# Patient Record
Sex: Female | Born: 1945 | Race: White | Hispanic: No | Smoking: Former smoker
Health system: Southern US, Community
[De-identification: ages and names within clinical notes are randomized; demographics above are authoritative.]

## PROBLEM LIST (undated history)

## (undated) DIAGNOSIS — I2510 Atherosclerotic heart disease of native coronary artery without angina pectoris: Secondary | ICD-10-CM

## (undated) DIAGNOSIS — I10 Essential (primary) hypertension: Secondary | ICD-10-CM

## (undated) DIAGNOSIS — T7840XA Allergy, unspecified, initial encounter: Secondary | ICD-10-CM

## (undated) DIAGNOSIS — M1990 Unspecified osteoarthritis, unspecified site: Secondary | ICD-10-CM

## (undated) DIAGNOSIS — R29818 Other symptoms and signs involving the nervous system: Secondary | ICD-10-CM

## (undated) DIAGNOSIS — G4730 Sleep apnea, unspecified: Secondary | ICD-10-CM

## (undated) HISTORY — PX: SPINAL FUSION: SHX223

## (undated) HISTORY — PX: TUBAL LIGATION: SHX77

## (undated) HISTORY — DX: Allergy, unspecified, initial encounter: T78.40XA

## (undated) HISTORY — DX: Essential (primary) hypertension: I10

## (undated) HISTORY — DX: Unspecified osteoarthritis, unspecified site: M19.90

---

## 2010-04-15 ENCOUNTER — Ambulatory Visit: Payer: Self-pay | Admitting: Ophthalmology

## 2010-05-14 ENCOUNTER — Ambulatory Visit: Payer: Self-pay | Admitting: Vascular Surgery

## 2015-11-19 ENCOUNTER — Other Ambulatory Visit: Payer: Self-pay | Admitting: Neurology

## 2015-11-19 DIAGNOSIS — M6281 Muscle weakness (generalized): Secondary | ICD-10-CM

## 2015-12-11 ENCOUNTER — Ambulatory Visit
Admission: RE | Admit: 2015-12-11 | Discharge: 2015-12-11 | Disposition: A | Discharge status: Home / Self Care | Payer: Medicare Other | Source: Ambulatory Visit | Attending: Neurology | Admitting: Neurology

## 2015-12-11 DIAGNOSIS — M6281 Muscle weakness (generalized): Secondary | ICD-10-CM | POA: Diagnosis present

## 2015-12-11 DIAGNOSIS — G9389 Other specified disorders of brain: Secondary | ICD-10-CM | POA: Insufficient documentation

## 2015-12-11 MED ORDER — GADOBENATE DIMEGLUMINE 529 MG/ML IV SOLN
20.0000 mL | Freq: Once | INTRAVENOUS | Status: AC | PRN
  Administered 2015-12-11: 18 mL via INTRAVENOUS

## 2016-11-20 ENCOUNTER — Other Ambulatory Visit (INDEPENDENT_AMBULATORY_CARE_PROVIDER_SITE_OTHER): Payer: Self-pay | Admitting: Vascular Surgery

## 2016-11-20 DIAGNOSIS — I6522 Occlusion and stenosis of left carotid artery: Secondary | ICD-10-CM

## 2016-11-24 ENCOUNTER — Ambulatory Visit (INDEPENDENT_AMBULATORY_CARE_PROVIDER_SITE_OTHER): Payer: Medicare Other | Admitting: Vascular Surgery

## 2016-11-24 ENCOUNTER — Encounter (INDEPENDENT_AMBULATORY_CARE_PROVIDER_SITE_OTHER): Payer: Self-pay | Admitting: Vascular Surgery

## 2016-11-24 ENCOUNTER — Ambulatory Visit (INDEPENDENT_AMBULATORY_CARE_PROVIDER_SITE_OTHER): Payer: Medicare Other

## 2016-11-24 ENCOUNTER — Encounter (INDEPENDENT_AMBULATORY_CARE_PROVIDER_SITE_OTHER): Payer: Self-pay

## 2016-11-24 VITALS — BP 130/86 | HR 87 | Resp 18 | Ht 63.0 in | Wt 200.0 lb

## 2016-11-24 DIAGNOSIS — I6529 Occlusion and stenosis of unspecified carotid artery: Secondary | ICD-10-CM | POA: Insufficient documentation

## 2016-11-24 DIAGNOSIS — I6522 Occlusion and stenosis of left carotid artery: Secondary | ICD-10-CM

## 2016-11-24 DIAGNOSIS — I10 Essential (primary) hypertension: Secondary | ICD-10-CM

## 2016-11-24 DIAGNOSIS — I6523 Occlusion and stenosis of bilateral carotid arteries: Secondary | ICD-10-CM

## 2016-11-24 NOTE — Assessment & Plan Note (Signed)
blood pressure control important in reducing the progression of atherosclerotic disease. On appropriate oral medications.  

## 2016-11-24 NOTE — Progress Notes (Signed)
MRN : [id number]  Cathy Harrell is a 71 y.o. ([birth year]) female who presents with chief complaint of  Chief Complaint  Patient presents with  . Follow-up  .  History of Present Illness: Patient returns in follow-up of her body disease. She is doing well with no focal neurologic symptoms. Specifically, the patient denies amaurosis fugax, speech or swallowing difficulties, or arm or leg weakness or numbness. Her biggest complaint is of some pain and numbness in her legs and she has been previously treated with medications for neuropathy without significant improvement. Her carotid duplex today reveals stable, mild 1-39% carotid artery stenosis bilaterally.  Current Outpatient Prescriptions  Medication Sig Dispense Refill  . aspirin 81 MG chewable tablet Chew 81 mg by mouth.    . B Complex Vitamins (VITAMIN B COMPLEX PO) Take by mouth.    . busPIRone (BUSPAR) 15 MG tablet TK 1 T PO BID    . Cholecalciferol (VITAMIN D) 2000 units tablet Take by mouth.    . cyanocobalamin (TH VITAMIN B12) 100 MCG tablet Take by mouth.    Marland Kitchen ibuprofen (ADVIL,MOTRIN) 200 MG tablet Take 200 mg by mouth.    . losartan (COZAAR) 50 MG tablet Take by mouth.    Marland Kitchen PARoxetine (PAXIL) 20 MG tablet TK 1 T PO ONCE D FOR 30 DAYS  2   No current facility-administered medications for this visit.     Past Medical History:  Diagnosis Date  . Allergy   . Hypertension   . Osteoarthritis     Past Surgical History:  Procedure Laterality Date  . SPINAL FUSION    . TUBAL LIGATION      Social History Social History  Substance Use Topics  . Smoking status: Former Games developer  . Smokeless tobacco: Never Used  . Alcohol use Yes     Comment: occasionally    Family History Family History  Problem Relation Age of Onset  . Cancer Sister   . Heart attack Brother   . Cancer Brother      No Known Allergies   REVIEW OF SYSTEMS (Negative unless checked)  Constitutional: [] Weight loss  [] Fever  [] Chills Cardiac:  [] Chest pain   [] Chest pressure   [] Palpitations   [] Shortness of breath when laying flat   [] Shortness of breath at rest   [] Shortness of breath with exertion. Vascular:  [x] Pain in legs with walking   [x] Pain in legs at rest   [] Pain in legs when laying flat   [] Claudication   [] Pain in feet when walking  [] Pain in feet at rest  [] Pain in feet when laying flat   [] History of DVT   [] Phlebitis   [] Swelling in legs   [] Varicose veins   [] Non-healing ulcers Pulmonary:   [] Uses home oxygen   [] Productive cough   [] Hemoptysis   [] Wheeze  [] COPD   [] Asthma Neurologic:  [] Dizziness  [] Blackouts   [] Seizures   [] History of stroke   [] History of TIA  [] Aphasia   [] Temporary blindness   [] Dysphagia   [] Weakness or numbness in arms   [x] Weakness or numbness in legs Musculoskeletal:  [] Arthritis   [] Joint swelling   [] Joint pain   [] Low back pain Hematologic:  [] Easy bruising  [] Easy bleeding   [] Hypercoagulable state   [] Anemic  [] Hepatitis Gastrointestinal:  [] Blood in stool   [] Vomiting blood  [] Gastroesophageal reflux/heartburn   [] Difficulty swallowing. Genitourinary:  [] Chronic kidney disease   [] Difficult urination  [] Frequent urination  [] Burning with urination   [] Blood in urine Skin:  []   Rashes   [] Ulcers   [] Wounds Psychological:  [] History of anxiety   []  History of major depression.  Physical Examination  Vitals:   11/24/16 1332 11/24/16 1333  BP: 132/80 130/86  Pulse: 87   Resp: 18   Weight: 200 lb (90.7 kg)   Height: 5\' 3"  (1.6 m)    Body mass index is 35.43 kg/m. Gen:  WD/WN, NAD Head: /AT, No temporalis wasting. Ear/Nose/Throat: Hearing grossly intact, nares w/o erythema or drainage, trachea midline Eyes: Conjunctiva clear. Sclera non-icteric Neck: Supple.  No bruit or JVD.  Pulmonary:  Good air movement, equal and clear to auscultation bilaterally.  Cardiac: RRR, normal S1, S2, no Murmurs, rubs or gallops. Vascular:  Vessel Right Left  Radial Palpable Palpable  Ulnar  Palpable Palpable  Brachial Palpable Palpable  Carotid Palpable, without bruit Palpable, without bruit  Aorta Not palpable N/A  Femoral Palpable Palpable  Popliteal Palpable Palpable  PT Palpable Palpable  DP Palpable Palpable   Gastrointestinal: soft, non-tender/non-distended. No guarding/reflex.  Musculoskeletal: M/S 5/5 throughout.  No deformity or atrophy. No edema. Neurologic: CN 2-12 intact. Sensation grossly intact in extremities.  Symmetrical.  Speech is fluent. Motor exam as listed above. Psychiatric: Judgment intact, Mood & affect appropriate for pt's clinical situation. Dermatologic: No rashes or ulcers noted.  No cellulitis or open wounds. Lymph : No Cervical, Axillary, or Inguinal lymphadenopathy.     CBC No results found for: WBC, HGB, HCT, MCV, PLT  BMET No results found for: NA, K, CL, CO2, GLUCOSE, BUN, CREATININE, CALCIUM, GFRNONAA, GFRAA CrCl cannot be calculated (No order found.).  COAG No results found for: INR, PROTIME  Radiology No results found.   Assessment/Plan Hypertension blood pressure control important in reducing the progression of atherosclerotic disease. On appropriate oral medications.   Carotid stenosis Her carotid duplex today reveals stable, mild 1-39% carotid artery stenosis bilaterally. This very mild degree of carotid artery stenosis does not increase her stroke risk. We can recheck this in 2 years with a duplex. Contact our office with problems in the interim.    Festus BarrenJason Nailyn Dearinger, MD  11/24/2016 2:07 PM    This note was created with Dragon medical transcription system.  Any errors from dictation are purely unintentional

## 2016-11-24 NOTE — Assessment & Plan Note (Signed)
Her carotid duplex today reveals stable, mild 1-39% carotid artery stenosis bilaterally. This very mild degree of carotid artery stenosis does not increase her stroke risk. We can recheck this in 2 years with a duplex. Contact our office with problems in the interim.

## 2017-02-04 IMAGING — MR MR HEAD WO/W CM
13 series · 44 of 48 positions shown · IV contrast (multihance)
Comparison: CTA of the neck 05/14/2010.  MRI brain 04/15/2010.

CLINICAL DATA: Muscle weakness.  Dizziness and balance issues.

EXAM:
MRI HEAD WITHOUT AND WITH CONTRAST
TECHNIQUE: Multiplanar, multiecho pulse sequences of the brain and surrounding
structures were obtained without and with intravenous contrast.
CONTRAST:  18mL MULTIHANCE GADOBENATE DIMEGLUMINE 529 MG/ML IV SOLN

[Series 2: T1 · sagittal · 5.0mm · 0.45mm/px · 3 of 23 slices shown (1 of 2)]
[im 1/23]
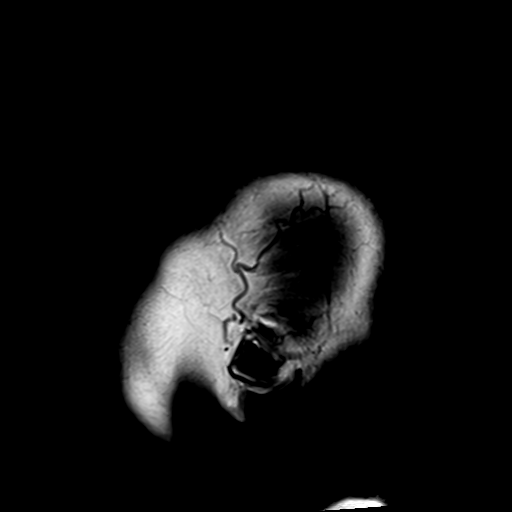
[im 12/23]
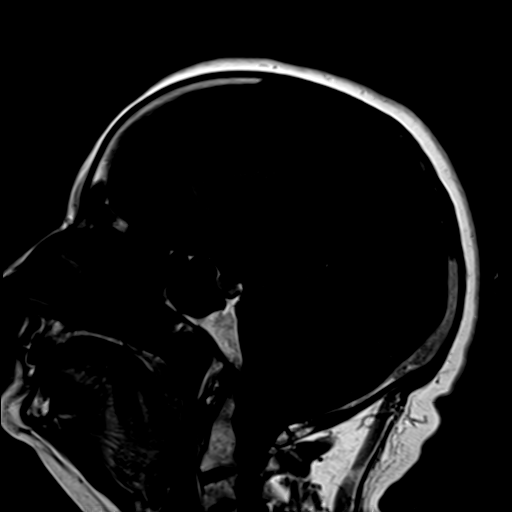
[im 23/23]
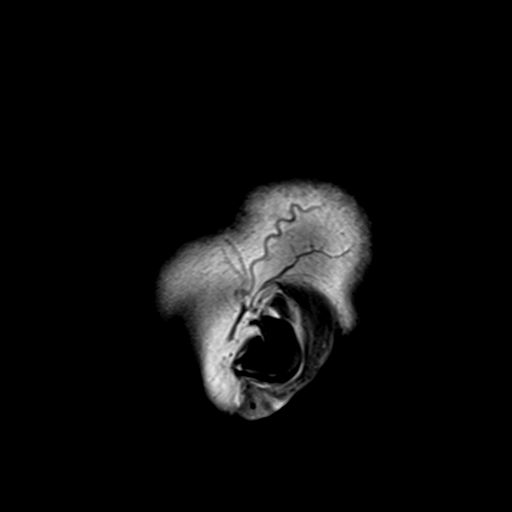

[Series 4: DWI · axial · 3.0mm · 1.20mm/px · z∈[-51,+105]mm · 6 of 55 slices shown (1 of 4)]
[im 1/55]
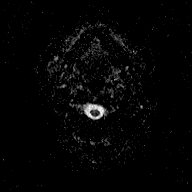
[im 11/55]
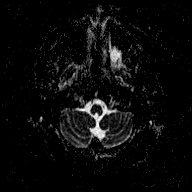
[im 22/55]
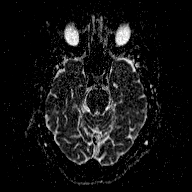
[im 33/55]
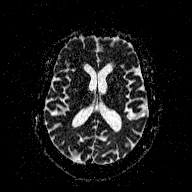
[im 44/55]
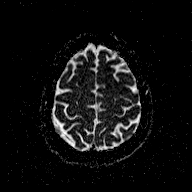
[im 55/55]
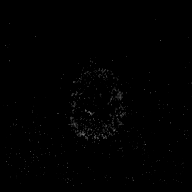

[Series 5: T2 · axial · 5.0mm · 0.72mm/px · z∈[-51,+105]mm · 2 of 26 slices shown (1 of 2)]
[im 1/26]
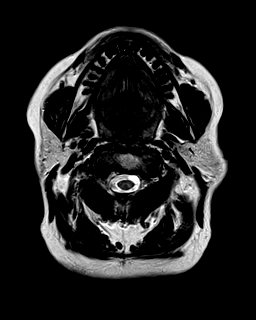
[im 26/26]
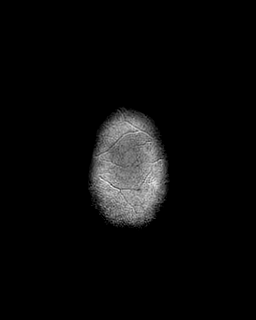

[Series 6: FLAIR · axial · 5.0mm · 0.45mm/px · z∈[-51,+105]mm · 2 of 26 slices shown (1 of 2)]
[im 1/26]
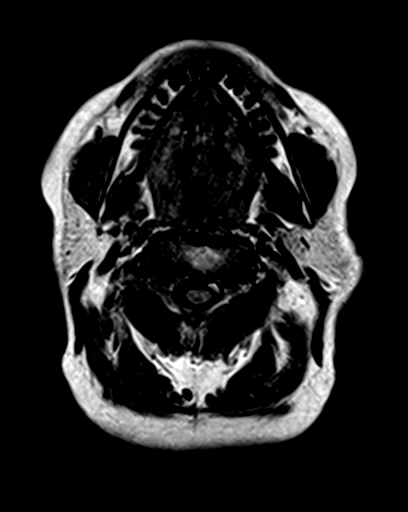
[im 26/26]
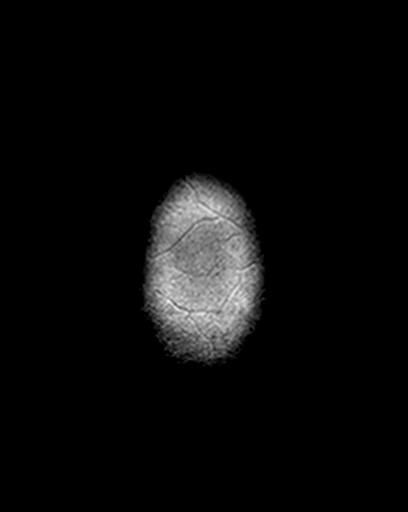

[Series 7: T2 · axial · 5.0mm · 0.72mm/px · z∈[-51,+105]mm · 2 of 26 slices shown (2 of 2)]
[im 1/26]
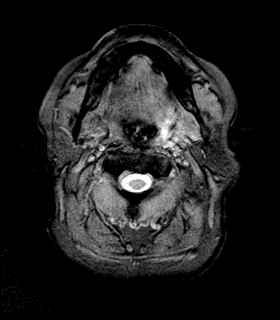
[im 26/26]
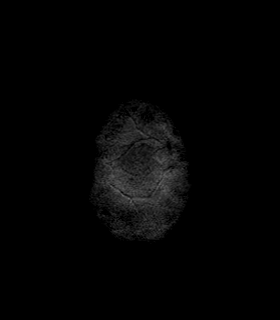

[Series 8: FLAIR · sagittal · 5.0mm · 0.45mm/px · 2 of 23 slices shown (2 of 2)]
[im 1/23]
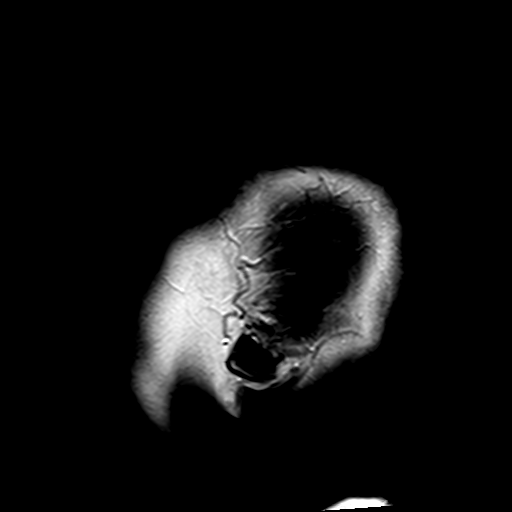
[im 23/23]
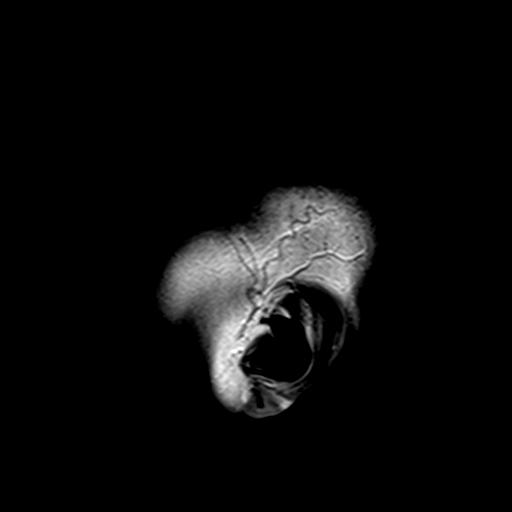

[Series 9: T1 · axial · 3.0mm · 1.00mm/px · z∈[-60,-25]mm · 2 of 64 slices shown (2 of 2)]
[im 1/64]
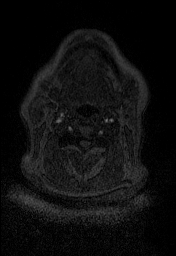
[im 13/64]
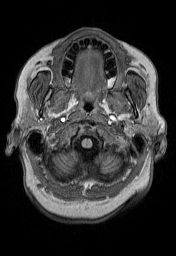

[Series 11: DWI · coronal · 3.0mm · 1.20mm/px · 4 of 45 slices shown (2 of 4)]
[im 1/45]
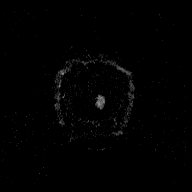
[im 15/45]
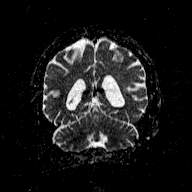
[im 30/45]
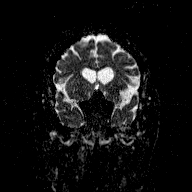
[im 45/45]
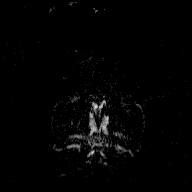

[Series 12: T2 post-contrast · coronal · 5.0mm · 0.45mm/px · 3 of 29 slices shown]
[im 1/29]
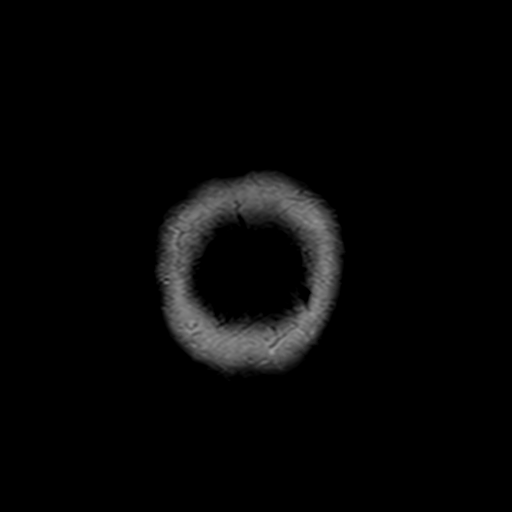
[im 15/29]
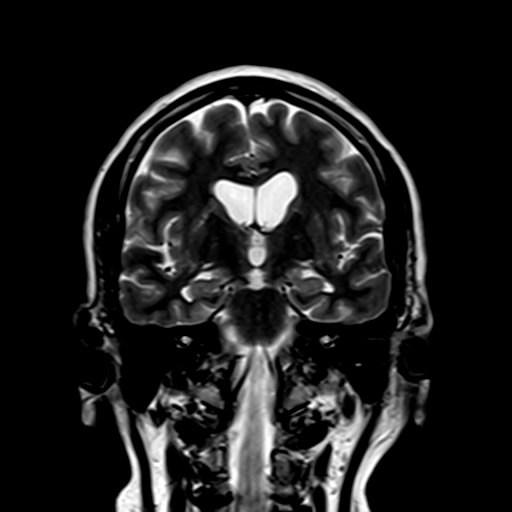
[im 29/29]
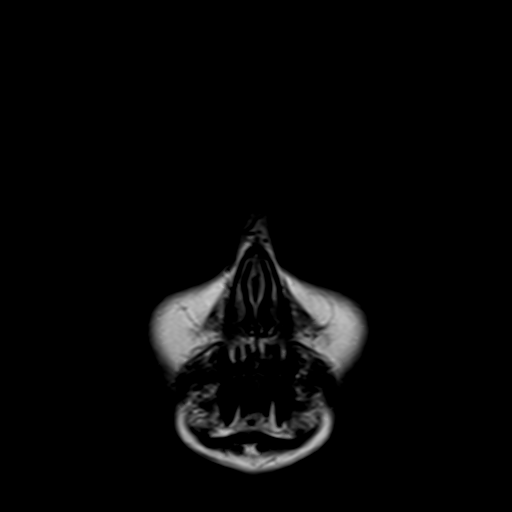

[Series 13: T1 post-contrast · axial · 3.0mm · 1.00mm/px · z∈[-60,+121]mm · 6 of 64 slices shown (1 of 2)]
[im 1/64]
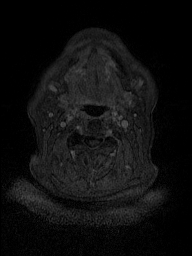
[im 13/64]
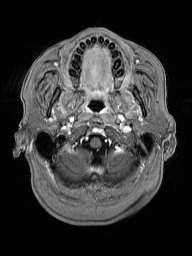
[im 26/64]
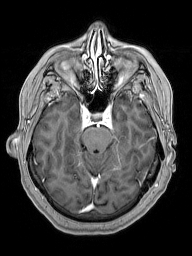
[im 38/64]
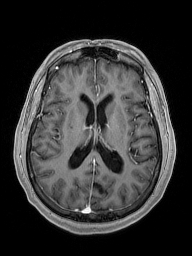
[im 51/64]
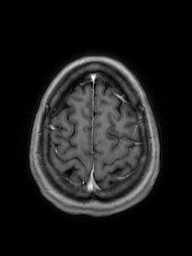
[im 64/64]
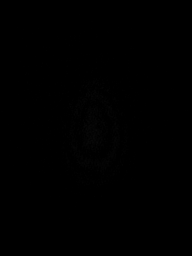

[Series 14: T1 post-contrast · coronal · 5.0mm · 0.45mm/px · 3 of 29 slices shown (2 of 2)]
[im 1/29]
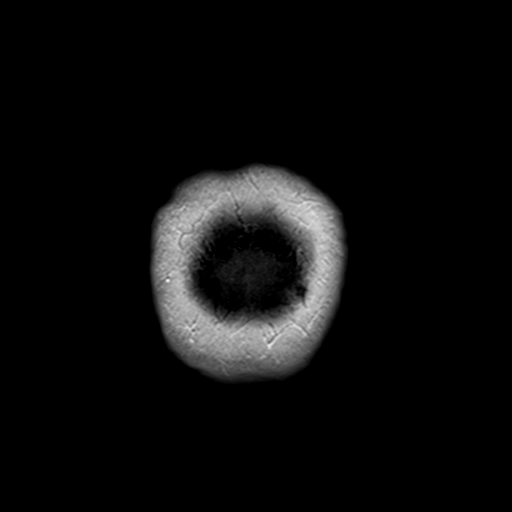
[im 15/29]
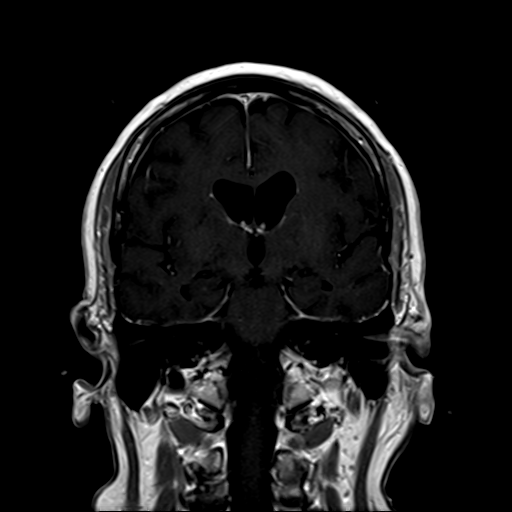
[im 29/29]
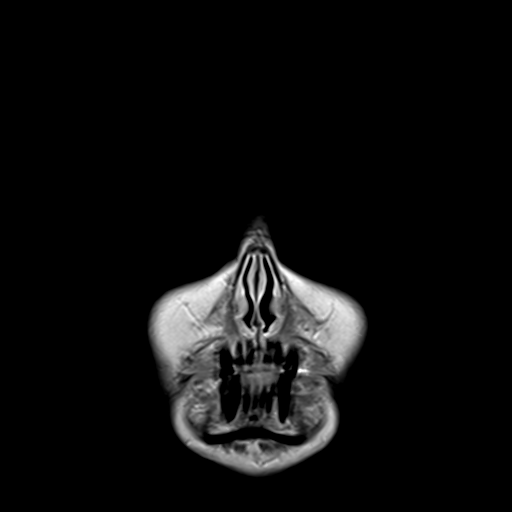

[Series 100: DWI · axial · 3.0mm · 1.20mm/px · z∈[-51,+105]mm · 5 of 55 slices shown (3 of 4)]
[im 1/55]
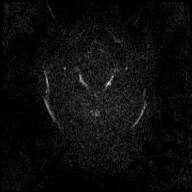
[im 14/55]
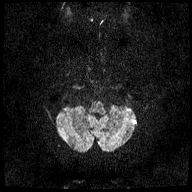
[im 28/55]
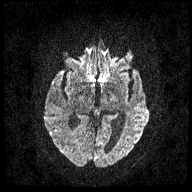
[im 41/55]
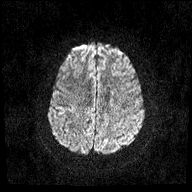
[im 55/55]
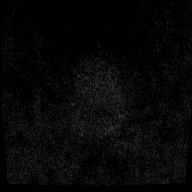

[Series 101: DWI · coronal · 3.0mm · 1.20mm/px · 4 of 45 slices shown (4 of 4)]
[im 1/45]
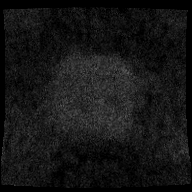
[im 15/45]
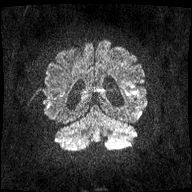
[im 30/45]
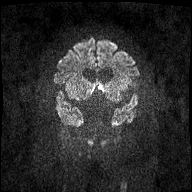
[im 45/45]
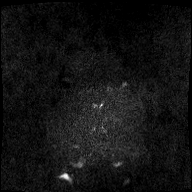

[44 of 48 positions shown; findings below may reference images not displayed]

FINDINGS: Remote encephalomalacia is again noted within the medial left
parietal and occipital lobes. Remote lacunar infarcts are present in
the basal ganglia and right corona radiata. T2 shine through is
present on the diffusion sequences. No acute infarct is present.
There is no hemorrhage or mass lesion.

Ventricles are of normal size. No significant extra-axial fluid
collection is present. Flow is present in the major intracranial
arteries. A cystic lesion at the floor of the left maxillary sinus
is stable, likely associated with the teeth. The paranasal sinuses
are otherwise clear.

The internal auditory canals are within normal limits bilaterally.
Flow is present in the major intracranial arteries. The globes and
orbits are intact. The paranasal sinuses and the mastoid air cells
are clear.

Skullbase the is within normal limits. Midline sagittal images are
unremarkable.

The postcontrast images demonstrate no pathologic enhancement.
IMPRESSION: 1. Stable remote encephalomalacia of the medial left parietal and
occipital lobes.
2. Stable appearance of remote lacunar infarcts involving the basal
ganglia bilaterally and the right corona radiata.
3. No acute intracranial abnormality.

## 2021-08-31 DIAGNOSIS — Z87442 Personal history of urinary calculi: Secondary | ICD-10-CM

## 2021-08-31 HISTORY — DX: Personal history of urinary calculi: Z87.442

## 2021-12-18 ENCOUNTER — Encounter: Payer: Self-pay | Admitting: Ophthalmology

## 2021-12-18 ENCOUNTER — Other Ambulatory Visit: Payer: Self-pay

## 2021-12-25 NOTE — Discharge Instructions (Signed)

## 2022-01-12 ENCOUNTER — Ambulatory Visit: Payer: Medicare Other | Admitting: Anesthesiology

## 2022-01-12 ENCOUNTER — Other Ambulatory Visit: Payer: Self-pay

## 2022-01-12 ENCOUNTER — Encounter: Payer: Self-pay | Admitting: Ophthalmology

## 2022-01-12 ENCOUNTER — Encounter
Admission: RE | Disposition: A | Discharge status: Home / Self Care | Payer: Self-pay | Source: Home / Self Care | Attending: Ophthalmology

## 2022-01-12 ENCOUNTER — Ambulatory Visit
Admission: RE | Admit: 2022-01-12 | Discharge: 2022-01-12 | Disposition: A | Discharge status: Home / Self Care | Payer: Medicare Other | Attending: Ophthalmology | Admitting: Ophthalmology

## 2022-01-12 DIAGNOSIS — I10 Essential (primary) hypertension: Secondary | ICD-10-CM | POA: Diagnosis not present

## 2022-01-12 DIAGNOSIS — H2512 Age-related nuclear cataract, left eye: Secondary | ICD-10-CM | POA: Diagnosis present

## 2022-01-12 DIAGNOSIS — Z6837 Body mass index (BMI) 37.0-37.9, adult: Secondary | ICD-10-CM | POA: Insufficient documentation

## 2022-01-12 DIAGNOSIS — G4730 Sleep apnea, unspecified: Secondary | ICD-10-CM | POA: Insufficient documentation

## 2022-01-12 DIAGNOSIS — Z87891 Personal history of nicotine dependence: Secondary | ICD-10-CM | POA: Insufficient documentation

## 2022-01-12 DIAGNOSIS — E669 Obesity, unspecified: Secondary | ICD-10-CM | POA: Diagnosis not present

## 2022-01-12 HISTORY — PX: CATARACT EXTRACTION W/PHACO: SHX586

## 2022-01-12 HISTORY — DX: Sleep apnea, unspecified: G47.30

## 2022-01-12 SURGERY — PHACOEMULSIFICATION, CATARACT, WITH IOL INSERTION
Anesthesia: Monitor Anesthesia Care | Site: Eye | Laterality: Left

## 2022-01-12 MED ORDER — SIGHTPATH DOSE#1 BSS IO SOLN
INTRAOCULAR | Status: DC | PRN
  Administered 2022-01-12: 15 mL

## 2022-01-12 MED ORDER — ACETAMINOPHEN 325 MG PO TABS: 650.0000 mg | ORAL_TABLET | ORAL | Status: DC | PRN

## 2022-01-12 MED ORDER — SIGHTPATH DOSE#1 SODIUM HYALURONATE 23 MG/ML IO SOLUTION
PREFILLED_SYRINGE | INTRAOCULAR | Status: DC | PRN
  Administered 2022-01-12: 0.6 mL via INTRAOCULAR

## 2022-01-12 MED ORDER — ACETAMINOPHEN 160 MG/5ML PO SOLN: 325.0000 mg | ORAL | Status: DC | PRN

## 2022-01-12 MED ORDER — LACTATED RINGERS IV SOLN: INTRAVENOUS | Status: DC

## 2022-01-12 MED ORDER — SIGHTPATH DOSE#1 BSS IO SOLN
INTRAOCULAR | Status: DC | PRN
  Administered 2022-01-12: 65 mL via OPHTHALMIC

## 2022-01-12 MED ORDER — CYCLOPENTOLATE HCL 2 % OP SOLN
1.0000 [drp] | OPHTHALMIC | Status: AC | PRN
Start: 2022-01-12 — End: 2022-01-12
  Administered 2022-01-12 (×3): 1 [drp] via OPHTHALMIC

## 2022-01-12 MED ORDER — MIDAZOLAM HCL 2 MG/2ML IJ SOLN
INTRAMUSCULAR | Status: DC | PRN
  Administered 2022-01-12: 1 mg via INTRAVENOUS

## 2022-01-12 MED ORDER — ONDANSETRON HCL 4 MG/2ML IJ SOLN: 4.0000 mg | Freq: Once | INTRAMUSCULAR | Status: DC | PRN

## 2022-01-12 MED ORDER — TETRACAINE HCL 0.5 % OP SOLN
1.0000 [drp] | OPHTHALMIC | Status: DC | PRN
  Administered 2022-01-12 (×3): 1 [drp] via OPHTHALMIC

## 2022-01-12 MED ORDER — PHENYLEPHRINE HCL 10 % OP SOLN
1.0000 [drp] | OPHTHALMIC | Status: AC | PRN
  Administered 2022-01-12 (×3): 1 [drp] via OPHTHALMIC

## 2022-01-12 MED ORDER — MOXIFLOXACIN HCL 0.5 % OP SOLN
OPHTHALMIC | Status: DC | PRN
  Administered 2022-01-12: 0.2 mL via OPHTHALMIC

## 2022-01-12 MED ORDER — SIGHTPATH DOSE#1 SODIUM HYALURONATE 10 MG/ML IO SOLUTION
PREFILLED_SYRINGE | INTRAOCULAR | Status: DC | PRN
  Administered 2022-01-12: 0.85 mL via INTRAOCULAR

## 2022-01-12 MED ORDER — FENTANYL CITRATE (PF) 100 MCG/2ML IJ SOLN
INTRAMUSCULAR | Status: DC | PRN
  Administered 2022-01-12: 50 ug via INTRAVENOUS

## 2022-01-12 MED ORDER — LIDOCAINE HCL (PF) 2 % IJ SOLN
INTRAOCULAR | Status: DC | PRN
  Administered 2022-01-12: 1 mL via INTRAOCULAR

## 2022-01-12 SURGICAL SUPPLY — 16 items
CATARACT SUITE SIGHTPATH (MISCELLANEOUS) ×2 IMPLANT
DISSECTOR HYDRO NUCLEUS 50X22 (MISCELLANEOUS) ×2 IMPLANT
FEE CATARACT SUITE SIGHTPATH (MISCELLANEOUS) ×1 IMPLANT
GLOVE SURG GAMMEX PI TX LF 7.5 (GLOVE) ×2 IMPLANT
GLOVE SURG SYN 8.5  E (GLOVE) ×1
GLOVE SURG SYN 8.5 E (GLOVE) ×1 IMPLANT
GLOVE SURG SYN 8.5 PF PI (GLOVE) ×1 IMPLANT
LENS IOL ACRSF IQ VT 15 23.0 IMPLANT
LENS IOL ACRYSOF VIVITY 23.0 ×2 IMPLANT
LENS IOL VIVITY 015 23.0 ×1 IMPLANT
NDL FILTER BLUNT 18X1 1/2 (NEEDLE) ×1 IMPLANT
NEEDLE FILTER BLUNT 18X 1/2SAF (NEEDLE) ×1
NEEDLE FILTER BLUNT 18X1 1/2 (NEEDLE) ×1 IMPLANT
SYR 3ML LL SCALE MARK (SYRINGE) ×2 IMPLANT
SYR 5ML LL (SYRINGE) ×2 IMPLANT
WATER STERILE IRR 250ML POUR (IV SOLUTION) ×2 IMPLANT

## 2022-01-12 NOTE — Transfer of Care (Signed)
Immediate Anesthesia Transfer of Care Note ? ?Patient: Cathy Harrell ? ?Procedure(s) Performed: CATARACT EXTRACTION PHACO AND INTRAOCULAR LENS PLACEMENT (IOC) LEFT VIVITY LENS 3.16 00:30.0 (Left: Eye) ? ?Patient Location: PACU ? ?Anesthesia Type: MAC ? ?Level of Consciousness: awake, alert  and patient cooperative ? ?Airway and Oxygen Therapy: Patient Spontanous Breathing and Patient connected to supplemental oxygen ? ?Post-op Assessment: Post-op Vital signs reviewed, Patient's Cardiovascular Status Stable, Respiratory Function Stable, Patent Airway and No signs of Nausea or vomiting ? ?Post-op Vital Signs: Reviewed and stable ? ?Complications: No notable events documented. ? ?

## 2022-01-12 NOTE — Op Note (Signed)
OPERATIVE NOTE ? ?Cathy Harrell ?[id number] ?01/12/2022 ? ? ?PREOPERATIVE DIAGNOSIS:  Nuclear sclerotic cataract left eye.  H25.12 ?  ?POSTOPERATIVE DIAGNOSIS:    Nuclear sclerotic cataract left eye.   ?  ?PROCEDURE:  Phacoemusification with posterior chamber intraocular lens placement of the left eye  ? ?LENS:   ?Implant Name Type Inv. Item Serial No. Manufacturer Lot No. LRB No. Used Action  ?LENS IOL ACRYSOF VIVITY 23.0 - [id number]  LENS IOL ACRYSOF VIVITY 23.0 [id number] [facility]  Left 1 Implanted  ?    ?Procedure(s): ?CATARACT EXTRACTION PHACO AND INTRAOCULAR LENS PLACEMENT (IOC) LEFT VIVITY LENS 3.16 00:30.0 (Left) ? ?[id number] ?  ?ULTRASOUND TIME: 0 minutes 30 seconds.  CDE 3.16 ?  ?SURGEON:  Willey Blade, MD, MPH ?  ?ANESTHESIA:  Topical with tetracaine drops augmented with 1% preservative-free intracameral lidocaine. ? ?ESTIMATED BLOOD LOSS: <1 mL ?  ?COMPLICATIONS:  None. ?  ?DESCRIPTION OF PROCEDURE:  The patient was identified in the holding room and transported to the operating room and placed in the supine position under the operating microscope.  The left eye was identified as the operative eye and it was prepped and draped in the usual sterile ophthalmic fashion. ?  ?A 1.0 millimeter clear-corneal paracentesis was made at the 5:00 position. 0.5 ml of preservative-free 1% lidocaine with epinephrine was injected into the anterior chamber. ? The anterior chamber was filled with Healon 5 viscoelastic.  A 2.4 millimeter keratome was used to make a near-clear corneal incision at the 2:00 position.  A curvilinear capsulorrhexis was made with a cystotome and capsulorrhexis forceps.  Balanced salt solution was used to hydrodissect and hydrodelineate the nucleus. ?  ?Phacoemulsification was then used in stop and chop fashion to remove the lens nucleus and epinucleus.  The remaining cortex was then removed using the irrigation and aspiration handpiece. Healon was then placed into the capsular bag to distend it  for lens placement.  A lens was then injected into the capsular bag.  The remaining viscoelastic was aspirated. ?  ?Wounds were hydrated with balanced salt solution.  The anterior chamber was inflated to a physiologic pressure with balanced salt solution. ? ?The lens was well centered. ? ?Intracameral vigamox 0.1 mL undiltued was injected into the eye and a drop placed onto the ocular surface. ? No wound leaks were noted.  The patient was taken to the recovery room in stable condition without complications of anesthesia or surgery ? ?Willey Blade ?01/12/2022, 12:57 PM ? ?

## 2022-01-12 NOTE — H&P (Signed)
[facility]  ? ?Primary Care Physician:  Laqueta Due, MD ?Ophthalmologist: Dr. Willey Blade ? ?Pre-Procedure History & Physical: ?HPI:  Cathy Harrell is a 76 y.o. female here for cataract surgery. ?  ?Past Medical History:  ?Diagnosis Date  ? Allergy   ? History of kidney stones 08/2021  ? Hypertension   ? Osteoarthritis   ? Sleep apnea   ? cannot use CPAP  ? ? ?Past Surgical History:  ?Procedure Laterality Date  ? SPINAL FUSION    ? Cervical  ? TUBAL LIGATION    ? ? ?Prior to Admission medications   ?Medication Sig Start Date End Date Taking? Authorizing Provider  ?aspirin 81 MG chewable tablet Chew 81 mg by mouth.   Yes [provider]  ?Cholecalciferol (VITAMIN D) 2000 units tablet Take by mouth.   Yes [provider]  ?D-Mannose 500 MG CAPS Take 3 capsules by mouth daily.   Yes [provider]  ?FLUTICASONE PROPIONATE, NASAL, NA Place into the nose as needed.   Yes [provider]  ?ibuprofen (ADVIL,MOTRIN) 200 MG tablet Take 200 mg by mouth every 8 (eight) hours as needed.   Yes [provider]  ?losartan (COZAAR) 50 MG tablet Take by mouth.   Yes [provider]  ?meloxicam (MOBIC) 7.5 MG tablet Take 7.5 mg by mouth in the morning and at bedtime.   Yes [provider]  ?omeprazole (PRILOSEC) 20 MG capsule Take 20 mg by mouth daily.   Yes [provider]  ?PARoxetine (PAXIL) 20 MG tablet 30 mg daily. 11/05/16  Yes [provider]  ?B Complex Vitamins (VITAMIN B COMPLEX PO) Take by mouth. ?Patient not taking: Reported on 12/18/2021    [provider]  ?busPIRone (BUSPAR) 15 MG tablet TK 1 T PO BID ?Patient not taking: Reported on 12/18/2021 02/13/16   [provider]  ?cyanocobalamin 100 MCG tablet Take by mouth. ?Patient not taking: Reported on 12/18/2021    [provider]  ?famotidine (PEPCID) 40 MG tablet Take 40 mg by mouth daily. ?Patient not taking: Reported on 01/12/2022    [provider]  ?fluorometholone (FML) 0.1 % ophthalmic ointment Place 1 application. into both eyes daily. ?Patient not taking: Reported on 01/12/2022    [provider]  ?LORazepam (ATIVAN) 1 MG tablet Take 1 mg by mouth every 8 (eight) hours as needed for anxiety. ?Patient not taking: Reported on 01/12/2022    [provider]  ? ? ?Allergies as of 11/25/2021  ? (No Known Allergies)  ? ? ?Family History  ?Problem Relation Age of Onset  ? Cancer Sister   ? Heart attack Brother   ? Cancer Brother   ? ? ?Social History  ? ?Socioeconomic History  ? Marital status: Divorced  ?  Spouse name: Not on file  ? Number of children: Not on file  ? Years of education: Not on file  ? Highest education level: Not on file  ?Occupational History  ? Not on file  ?Tobacco Use  ? Smoking status: Former  ?  Packs/day: 1.00  ?  Years: 15.00  ?  Pack years: 15.00  ?  Types: Cigarettes  ?  Quit date: 74  ?  Years since quitting: 33.3  ? Smokeless tobacco: Never  ?Substance and Sexual Activity  ? Alcohol use: Yes  ?  Comment: occasionally  ? Drug use: No  ? Sexual activity: Not on file  ?Other Topics Concern  ? Not on file  ?  Social History Narrative  ? Not on file  ? ?Social Determinants of Health  ? ?Financial Resource Strain: Not on file  ?Food Insecurity: Not on file  ?Transportation Needs: Not on file  ?Physical Activity: Not on file  ?Stress: Not on file  ?Social Connections: Not on file  ?Intimate Partner Violence: Not on file  ? ? ?Review of Systems: ?See HPI, otherwise negative ROS ? ?Physical Exam: ?BP (!) 141/72   Pulse 93   Temp (!) 97.1 ?F (36.2 ?C) (Temporal)   Resp 18   Ht 5\' 3"  (1.6 m)   Wt 93.8 kg   SpO2 95%   BMI 36.62 kg/m?  ?General:   Alert, cooperative in NAD ?Head:  Normocephalic and atraumatic. ?Respiratory:  Normal work of breathing. ?Cardiovascular:  RRR ? ?Impression/Plan: ?Shanette Tamargo is here for cataract surgery. ? ?Risks, benefits, limitations, and alternatives regarding cataract  surgery have been reviewed with the patient.  Questions have been answered.  All parties agreeable. ? ? ?Britta Mccreedy, MD  01/12/2022, 12:31 PM ? ? ?

## 2022-01-12 NOTE — Anesthesia Postprocedure Evaluation (Signed)
Anesthesia Post Note ? ?Patient: Cathy Harrell ? ?Procedure(s) Performed: CATARACT EXTRACTION PHACO AND INTRAOCULAR LENS PLACEMENT (IOC) LEFT VIVITY LENS 3.16 00:30.0 (Left: Eye) ? ? ?  ?Patient location during evaluation: PACU ?Anesthesia Type: MAC ?Level of consciousness: awake and alert ?Pain management: pain level controlled ?Vital Signs Assessment: post-procedure vital signs reviewed and stable ?Respiratory status: spontaneous breathing, nonlabored ventilation, respiratory function stable and patient connected to nasal cannula oxygen ?Cardiovascular status: stable and blood pressure returned to baseline ?Postop Assessment: no apparent nausea or vomiting ?Anesthetic complications: no ? ? ?No notable events documented. ? ?Tishia Maestre A  Devan Danzer ? ? ? ? ? ?

## 2022-01-12 NOTE — Anesthesia Preprocedure Evaluation (Signed)
Anesthesia Evaluation  ?Patient identified by MRN, date of birth, ID band ?Patient awake ? ? ? ?Reviewed: ?Allergy & Precautions, NPO status , Patient's Chart, lab work & pertinent test results, reviewed documented beta blocker date and time  ? ?History of Anesthesia Complications ?Negative for: history of anesthetic complications ? ?Airway ?Mallampati: III ? ?TM Distance: >3 FB ?Neck ROM: Limited ? ? ? Dental ?  ?Pulmonary ?sleep apnea , former smoker,  ?  ?breath sounds clear to auscultation ? ? ? ? ? ? Cardiovascular ?hypertension, (-) angina(-) DOE  ?Rhythm:Regular Rate:Normal ? ? ?  ?Neuro/Psych ?  ? GI/Hepatic ?neg GERD  ,  ?Endo/Other  ? ? Renal/GU ?  ? ?  ?Musculoskeletal ? ?(+) Arthritis ,  ? Abdominal ?(+) + obese (BMI 37),   ?Peds ? Hematology ?  ?Anesthesia Other Findings ? ? Reproductive/Obstetrics ? ?  ? ? ? ? ? ? ? ? ? ? ? ? ? ?  ?  ? ? ? ? ? ? ? ? ?Anesthesia Physical ?Anesthesia Plan ? ?ASA: 2 ? ?Anesthesia Plan: MAC  ? ?Post-op Pain Management:   ? ?Induction: Intravenous ? ?PONV Risk Score and Plan: 2 and TIVA, Midazolam and Treatment may vary due to age or medical condition ? ?Airway Management Planned: Nasal Cannula ? ?Additional Equipment:  ? ?Intra-op Plan:  ? ?Post-operative Plan:  ? ?Informed Consent: I have reviewed the patients History and Physical, chart, labs and discussed the procedure including the risks, benefits and alternatives for the proposed anesthesia with the patient or authorized representative who has indicated his/her understanding and acceptance.  ? ? ? ? ? ?Plan Discussed with: CRNA and Anesthesiologist ? ?Anesthesia Plan Comments:   ? ? ? ? ? ? ?Anesthesia Quick Evaluation ? ?

## 2022-01-13 ENCOUNTER — Other Ambulatory Visit: Payer: Self-pay

## 2022-01-13 ENCOUNTER — Encounter: Payer: Self-pay | Admitting: Ophthalmology

## 2022-01-29 NOTE — Discharge Instructions (Signed)

## 2022-02-02 ENCOUNTER — Ambulatory Visit: Payer: Medicare Other | Admitting: Anesthesiology

## 2022-02-02 ENCOUNTER — Encounter
Admission: RE | Disposition: A | Discharge status: Home / Self Care | Payer: Self-pay | Source: Home / Self Care | Attending: Ophthalmology

## 2022-02-02 ENCOUNTER — Encounter: Payer: Self-pay | Admitting: Ophthalmology

## 2022-02-02 ENCOUNTER — Ambulatory Visit
Admission: RE | Admit: 2022-02-02 | Discharge: 2022-02-02 | Disposition: A | Discharge status: Home / Self Care | Payer: Medicare Other | Attending: Ophthalmology | Admitting: Ophthalmology

## 2022-02-02 ENCOUNTER — Other Ambulatory Visit: Payer: Self-pay

## 2022-02-02 DIAGNOSIS — F419 Anxiety disorder, unspecified: Secondary | ICD-10-CM | POA: Diagnosis not present

## 2022-02-02 DIAGNOSIS — Z8673 Personal history of transient ischemic attack (TIA), and cerebral infarction without residual deficits: Secondary | ICD-10-CM | POA: Insufficient documentation

## 2022-02-02 DIAGNOSIS — Z6835 Body mass index (BMI) 35.0-35.9, adult: Secondary | ICD-10-CM | POA: Diagnosis not present

## 2022-02-02 DIAGNOSIS — Z87891 Personal history of nicotine dependence: Secondary | ICD-10-CM | POA: Insufficient documentation

## 2022-02-02 DIAGNOSIS — H2511 Age-related nuclear cataract, right eye: Secondary | ICD-10-CM | POA: Insufficient documentation

## 2022-02-02 DIAGNOSIS — I10 Essential (primary) hypertension: Secondary | ICD-10-CM | POA: Insufficient documentation

## 2022-02-02 DIAGNOSIS — M1990 Unspecified osteoarthritis, unspecified site: Secondary | ICD-10-CM | POA: Diagnosis not present

## 2022-02-02 DIAGNOSIS — K219 Gastro-esophageal reflux disease without esophagitis: Secondary | ICD-10-CM | POA: Insufficient documentation

## 2022-02-02 DIAGNOSIS — G4730 Sleep apnea, unspecified: Secondary | ICD-10-CM | POA: Diagnosis not present

## 2022-02-02 HISTORY — PX: CATARACT EXTRACTION W/PHACO: SHX586

## 2022-02-02 SURGERY — PHACOEMULSIFICATION, CATARACT, WITH IOL INSERTION
Anesthesia: Monitor Anesthesia Care | Site: Eye | Laterality: Right

## 2022-02-02 MED ORDER — SIGHTPATH DOSE#1 SODIUM HYALURONATE 23 MG/ML IO SOLUTION
PREFILLED_SYRINGE | INTRAOCULAR | Status: DC | PRN
  Administered 2022-02-02: 0.6 mL via INTRAOCULAR

## 2022-02-02 MED ORDER — OXYCODONE HCL 5 MG/5ML PO SOLN: 5.0000 mg | Freq: Once | ORAL | Status: DC | PRN

## 2022-02-02 MED ORDER — MOXIFLOXACIN HCL 0.5 % OP SOLN
OPHTHALMIC | Status: DC | PRN
  Administered 2022-02-02: 0.2 mL via OPHTHALMIC

## 2022-02-02 MED ORDER — LACTATED RINGERS IV SOLN: INTRAVENOUS | Status: DC

## 2022-02-02 MED ORDER — SIGHTPATH DOSE#1 SODIUM HYALURONATE 10 MG/ML IO SOLUTION
PREFILLED_SYRINGE | INTRAOCULAR | Status: DC | PRN
  Administered 2022-02-02: 0.85 mL via INTRAOCULAR

## 2022-02-02 MED ORDER — ARMC OPHTHALMIC DILATING DROPS
1.0000 "application " | OPHTHALMIC | Status: DC | PRN
  Administered 2022-02-02 (×3): 1 via OPHTHALMIC

## 2022-02-02 MED ORDER — OXYCODONE HCL 5 MG PO TABS: 5.0000 mg | ORAL_TABLET | Freq: Once | ORAL | Status: DC | PRN

## 2022-02-02 MED ORDER — MIDAZOLAM HCL 2 MG/2ML IJ SOLN
INTRAMUSCULAR | Status: DC | PRN
  Administered 2022-02-02: 1.5 mg via INTRAVENOUS

## 2022-02-02 MED ORDER — SIGHTPATH DOSE#1 BSS IO SOLN
INTRAOCULAR | Status: DC | PRN
  Administered 2022-02-02: 67 mL via OPHTHALMIC

## 2022-02-02 MED ORDER — LIDOCAINE HCL (PF) 2 % IJ SOLN: INTRAOCULAR | Status: DC | PRN

## 2022-02-02 MED ORDER — FENTANYL CITRATE (PF) 100 MCG/2ML IJ SOLN
INTRAMUSCULAR | Status: DC | PRN
  Administered 2022-02-02: 50 ug via INTRAVENOUS

## 2022-02-02 MED ORDER — TETRACAINE HCL 0.5 % OP SOLN
1.0000 [drp] | OPHTHALMIC | Status: DC | PRN
  Administered 2022-02-02 (×3): 1 [drp] via OPHTHALMIC

## 2022-02-02 MED ORDER — SIGHTPATH DOSE#1 BSS IO SOLN
INTRAOCULAR | Status: DC | PRN
  Administered 2022-02-02: 15 mL

## 2022-02-02 SURGICAL SUPPLY — 22 items
CANNULA ANT/CHMB 27G (MISCELLANEOUS) IMPLANT
CANNULA ANT/CHMB 27GA (MISCELLANEOUS) IMPLANT
CATARACT SUITE SIGHTPATH (MISCELLANEOUS) ×2 IMPLANT
DISSECTOR HYDRO NUCLEUS 50X22 (MISCELLANEOUS) ×2 IMPLANT
FEE CATARACT SUITE SIGHTPATH (MISCELLANEOUS) ×1 IMPLANT
GLOVE SURG GAMMEX PI TX LF 7.5 (GLOVE) ×2 IMPLANT
GLOVE SURG SYN 8.5  E (GLOVE) ×1
GLOVE SURG SYN 8.5 E (GLOVE) ×1 IMPLANT
GLOVE SURG SYN 8.5 PF PI (GLOVE) ×1 IMPLANT
LENS IOL ACRSF IQ VT 15 23.5 IMPLANT
LENS IOL ACRYSOF VIVITY 23.5 ×2 IMPLANT
LENS IOL VIVITY 015 23.5 ×1 IMPLANT
NDL FILTER BLUNT 18X1 1/2 (NEEDLE) ×1 IMPLANT
NEEDLE FILTER BLUNT 18X 1/2SAF (NEEDLE) ×1
NEEDLE FILTER BLUNT 18X1 1/2 (NEEDLE) ×1 IMPLANT
PACK VIT ANT 23G (MISCELLANEOUS) IMPLANT
RING MALYGIN (MISCELLANEOUS) IMPLANT
SUT ETHILON 10-0 CS-B-6CS-B-6 (SUTURE)
SUTURE EHLN 10-0 CS-B-6CS-B-6 (SUTURE) IMPLANT
SYR 3ML LL SCALE MARK (SYRINGE) ×2 IMPLANT
SYR 5ML LL (SYRINGE) ×2 IMPLANT
WATER STERILE IRR 250ML POUR (IV SOLUTION) ×2 IMPLANT

## 2022-02-02 NOTE — Anesthesia Postprocedure Evaluation (Signed)
Anesthesia Post Note  Patient: Cathy Harrell  Procedure(s) Performed: CATARACT EXTRACTION PHACO AND INTRAOCULAR LENS PLACEMENT (IOC) RIGHT (Right: Eye)     Patient location during evaluation: PACU Anesthesia Type: MAC Level of consciousness: awake and alert Pain management: pain level controlled Vital Signs Assessment: post-procedure vital signs reviewed and stable Respiratory status: spontaneous breathing, nonlabored ventilation, respiratory function stable and patient connected to nasal cannula oxygen Cardiovascular status: blood pressure returned to baseline and stable Postop Assessment: no apparent nausea or vomiting Anesthetic complications: no   No notable events documented.  Fidel Levy

## 2022-02-02 NOTE — Op Note (Signed)
OPERATIVE NOTE  Cathy Harrell [id number] 02/02/2022   PREOPERATIVE DIAGNOSIS:  Nuclear sclerotic cataract right eye.  H25.11   POSTOPERATIVE DIAGNOSIS:    Nuclear sclerotic cataract right eye.     PROCEDURE:  Phacoemusification with posterior chamber intraocular lens placement of the right eye   LENS:   Implant Name Type Inv. Item Serial No. Manufacturer Lot No. LRB No. Used Action  LENS IOL ACRYSOF VIVITY 23.5 - [id number]  LENS IOL ACRYSOF VIVITY 23.5 [id number] [employer]  Right 1 Implanted       Procedure(s) with comments: CATARACT EXTRACTION PHACO AND INTRAOCULAR LENS PLACEMENT (IOC) RIGHT (Right) - 2.70 0:25.8  DFT015 +23.5   ULTRASOUND TIME: 0 minutes 25 seconds.  CDE 2.70   SURGEON:  Benay Pillow, MD, MPH  ANESTHESIOLOGIST: Anesthesiologist: Fidel Levy, MD CRNA: Garner Nash, CRNA   ANESTHESIA:  Topical with tetracaine drops augmented with 1% preservative-free intracameral lidocaine.  ESTIMATED BLOOD LOSS: less than 1 mL.   COMPLICATIONS:  None.   DESCRIPTION OF PROCEDURE:  The patient was identified in the holding room and transported to the operating room and placed in the supine position under the operating microscope.  The right eye was identified as the operative eye and it was prepped and draped in the usual sterile ophthalmic fashion.   A 1.0 millimeter clear-corneal paracentesis was made at the 10:30 position. 0.5 ml of preservative-free 1% lidocaine with epinephrine was injected into the anterior chamber.  The anterior chamber was filled with Healon 5 viscoelastic.  A 2.4 millimeter keratome was used to make a near-clear corneal incision at the 8:00 position.  A curvilinear capsulorrhexis was made with a cystotome and capsulorrhexis forceps.  Balanced salt solution was used to hydrodissect and hydrodelineate the nucleus.   Phacoemulsification was then used in stop and chop fashion to remove the lens nucleus and epinucleus.  The remaining cortex was  then removed using the irrigation and aspiration handpiece. Healon was then placed into the capsular bag to distend it for lens placement.  A lens was then injected into the capsular bag.  The remaining viscoelastic was aspirated.   Wounds were hydrated with balanced salt solution.  The anterior chamber was inflated to a physiologic pressure with balanced salt solution.   Intracameral vigamox 0.1 mL undiluted was injected into the eye and a drop placed onto the ocular surface.  No wound leaks were noted.  The patient was taken to the recovery room in stable condition without complications of anesthesia or surgery  Benay Pillow 02/02/2022, 1:37 PM

## 2022-02-02 NOTE — Transfer of Care (Signed)
Immediate Anesthesia Transfer of Care Note  Patient: Cathy Harrell  Procedure(s) Performed: CATARACT EXTRACTION PHACO AND INTRAOCULAR LENS PLACEMENT (IOC) RIGHT (Right: Eye)  Patient Location: PACU  Anesthesia Type: MAC  Level of Consciousness: awake, alert  and patient cooperative  Airway and Oxygen Therapy: Patient Spontanous Breathing and Patient connected to supplemental oxygen  Post-op Assessment: Post-op Vital signs reviewed, Patient's Cardiovascular Status Stable, Respiratory Function Stable, Patent Airway and No signs of Nausea or vomiting  Post-op Vital Signs: Reviewed and stable  Complications: No notable events documented.

## 2022-02-02 NOTE — Anesthesia Preprocedure Evaluation (Signed)
Anesthesia Evaluation  Patient identified by MRN, date of birth, ID band Patient awake    Reviewed: NPO status   Airway Mallampati: II  TM Distance: >3 FB Neck ROM: full    Dental no notable dental hx.    Pulmonary sleep apnea (no cpap) , former smoker,    Pulmonary exam normal        Cardiovascular Exercise Tolerance: Good hypertension, Normal cardiovascular exam  stress echo: 08/2021:  Left Ventricle  Ultrasound enhancing agent used to enhance wall motion interpretation.  Normal left venticular systolic function with no regional wall motion  abnormalities noted at rest.  No regional wall motion abnormalities noted poststress.  Overall global left ventricular systolic function increased post-stress.  Normal augmentation of all wall segments without evidence of ischemia with  stress.     Neuro/Psych Anxiety TIA (last 2013)   GI/Hepatic Neg liver ROS, GERD  Controlled,Hepatic steatosis   Endo/Other  Morbid obesity (bmi 36)  Renal/GU negative Renal ROS  negative genitourinary   Musculoskeletal  (+) Arthritis ,   Abdominal   Peds  Hematology negative hematology ROS (+)   Anesthesia Other Findings pcp: 01/2022: aylward;  had ecce on 01/12/2022;    Reproductive/Obstetrics                             Anesthesia Physical Anesthesia Plan  ASA: 2  Anesthesia Plan: MAC   Post-op Pain Management:    Induction:   PONV Risk Score and Plan: 2 and Midazolam and TIVA  Airway Management Planned:   Additional Equipment:   Intra-op Plan:   Post-operative Plan:   Informed Consent: I have reviewed the patients History and Physical, chart, labs and discussed the procedure including the risks, benefits and alternatives for the proposed anesthesia with the patient or authorized representative who has indicated his/her understanding and acceptance.       Plan Discussed with:  CRNA  Anesthesia Plan Comments:         Anesthesia Quick Evaluation

## 2022-02-02 NOTE — H&P (Signed)
[facility]   Primary Care Physician:  Laqueta Due, MD Ophthalmologist: Dr. Willey Blade  Pre-Procedure History & Physical: HPI:  Cathy Harrell is a 76 y.o. female here for cataract surgery.   Past Medical History:  Diagnosis Date   Allergy    History of kidney stones 08/2021   Hypertension    Osteoarthritis    Sleep apnea    cannot use CPAP    Past Surgical History:  Procedure Laterality Date   CATARACT EXTRACTION W/PHACO Left 01/12/2022   Procedure: CATARACT EXTRACTION PHACO AND INTRAOCULAR LENS PLACEMENT (IOC) LEFT VIVITY LENS 3.16 00:30.0;  Surgeon: Nevada Crane, MD;  Location: [facility];  Service: Ophthalmology;  Laterality: Left;   SPINAL FUSION     Cervical   TUBAL LIGATION      Prior to Admission medications   Medication Sig Start Date End Date Taking? Authorizing Provider  aspirin 81 MG chewable tablet Chew 81 mg by mouth.   Yes [provider]  Cholecalciferol (VITAMIN D) 2000 units tablet Take by mouth.   Yes [provider]  D-Mannose 500 MG CAPS Take 3 capsules by mouth daily.   Yes [provider]  FLUTICASONE PROPIONATE, NASAL, NA Place into the nose as needed.   Yes [provider]  ibuprofen (ADVIL,MOTRIN) 200 MG tablet Take 200 mg by mouth every 8 (eight) hours as needed.   Yes [provider]  losartan (COZAAR) 50 MG tablet Take by mouth.   Yes [provider]  meloxicam (MOBIC) 7.5 MG tablet Take 7.5 mg by mouth in the morning and at bedtime.   Yes [provider]  omeprazole (PRILOSEC) 20 MG capsule Take 20 mg by mouth daily.   Yes [provider]  PARoxetine (PAXIL) 20 MG tablet 30 mg daily. 11/05/16  Yes [provider]  B Complex Vitamins (VITAMIN B COMPLEX PO) Take by mouth. Patient not taking: Reported on 12/18/2021    [provider]  busPIRone (BUSPAR) 15 MG tablet TK 1 T PO BID Patient not taking: Reported on 12/18/2021 02/13/16    [provider]  cyanocobalamin 100 MCG tablet Take by mouth. Patient not taking: Reported on 12/18/2021    [provider]  famotidine (PEPCID) 40 MG tablet Take 40 mg by mouth daily. Patient not taking: Reported on 01/12/2022    [provider]  fluorometholone (FML) 0.1 % ophthalmic ointment Place 1 application. into both eyes daily. Patient not taking: Reported on 01/12/2022    [provider]  LORazepam (ATIVAN) 1 MG tablet Take 1 mg by mouth every 8 (eight) hours as needed for anxiety. Patient not taking: Reported on 01/12/2022    [provider]    Allergies as of 11/25/2021   (No Known Allergies)    Family History  Problem Relation Age of Onset   Cancer Sister    Heart attack Brother    Cancer Brother     Social History   Socioeconomic History   Marital status: Divorced    Spouse name: Not on file   Number of children: Not on file   Years of education: Not on file   Highest education level: Not on file  Occupational History   Not on file  Tobacco Use   Smoking status: Former    Packs/day: 1.00    Years: 15.00    Pack years: 15.00    Types: Cigarettes    Quit date: 1990    Years since quitting: 10.4  Smokeless tobacco: Never  Substance and Sexual Activity   Alcohol use: Yes    Comment: occasionally   Drug use: No   Sexual activity: Not on file  Other Topics Concern   Not on file  Social History Narrative   Not on file   Social Determinants of Health   Financial Resource Strain: Not on file  Food Insecurity: Not on file  Transportation Needs: Not on file  Physical Activity: Not on file  Stress: Not on file  Social Connections: Not on file  Intimate Partner Violence: Not on file    Review of Systems: See HPI, otherwise negative ROS  Physical Exam: BP (!) 144/78   Pulse 95   Temp (!) 97.5 F (36.4 C) (Temporal)   Ht 5\' 3"  (1.6 m)   Wt 92.1 kg   SpO2 95%   BMI 35.96 kg/m  General:   Alert,  cooperative in NAD Head:  Normocephalic and atraumatic. Respiratory:  Normal work of breathing. Cardiovascular:  RRR  Impression/Plan: Cathy Harrell is here for cataract surgery.  Risks, benefits, limitations, and alternatives regarding cataract surgery have been reviewed with the patient.  Questions have been answered.  All parties agreeable.   Benay Pillow, MD  02/02/2022, 1:12 PM

## 2022-02-03 ENCOUNTER — Encounter: Payer: Self-pay | Admitting: Ophthalmology

## 2022-09-04 ENCOUNTER — Ambulatory Visit (INDEPENDENT_AMBULATORY_CARE_PROVIDER_SITE_OTHER): Payer: Medicare Other

## 2022-09-04 ENCOUNTER — Ambulatory Visit
Admission: EM | Admit: 2022-09-04 | Discharge: 2022-09-04 | Disposition: A | Discharge status: Home / Self Care | Payer: Medicare Other | Attending: Physician Assistant | Admitting: Physician Assistant

## 2022-09-04 DIAGNOSIS — R0782 Intercostal pain: Secondary | ICD-10-CM | POA: Diagnosis not present

## 2022-09-04 DIAGNOSIS — R5383 Other fatigue: Secondary | ICD-10-CM | POA: Diagnosis present

## 2022-09-04 DIAGNOSIS — J069 Acute upper respiratory infection, unspecified: Secondary | ICD-10-CM | POA: Diagnosis not present

## 2022-09-04 DIAGNOSIS — Z79899 Other long term (current) drug therapy: Secondary | ICD-10-CM | POA: Insufficient documentation

## 2022-09-04 DIAGNOSIS — Z1152 Encounter for screening for COVID-19: Secondary | ICD-10-CM | POA: Insufficient documentation

## 2022-09-04 DIAGNOSIS — S20222A Contusion of left back wall of thorax, initial encounter: Secondary | ICD-10-CM

## 2022-09-04 DIAGNOSIS — R350 Frequency of micturition: Secondary | ICD-10-CM | POA: Diagnosis not present

## 2022-09-04 DIAGNOSIS — W19XXXA Unspecified fall, initial encounter: Secondary | ICD-10-CM | POA: Diagnosis not present

## 2022-09-04 DIAGNOSIS — R051 Acute cough: Secondary | ICD-10-CM | POA: Insufficient documentation

## 2022-09-04 DIAGNOSIS — R109 Unspecified abdominal pain: Secondary | ICD-10-CM | POA: Diagnosis not present

## 2022-09-04 LAB — URINALYSIS, ROUTINE W REFLEX MICROSCOPIC
Bilirubin Urine: NEGATIVE
Glucose, UA: 100 mg/dL — AB
Ketones, ur: NEGATIVE mg/dL
Leukocytes,Ua: NEGATIVE
Nitrite: NEGATIVE
Protein, ur: NEGATIVE mg/dL
Specific Gravity, Urine: 1.02 (ref 1.005–1.030)
pH: 6 (ref 5.0–8.0)

## 2022-09-04 LAB — URINALYSIS, MICROSCOPIC (REFLEX)

## 2022-09-04 LAB — SARS CORONAVIRUS 2 BY RT PCR: SARS Coronavirus 2 by RT PCR: NEGATIVE

## 2022-09-04 MED ORDER — PROMETHAZINE-DM 6.25-15 MG/5ML PO SYRP: 5.0000 mL | ORAL_SOLUTION | Freq: Four times a day (QID) | ORAL | 0 refills | Status: AC | PRN

## 2022-09-04 NOTE — Discharge Instructions (Addendum)
-  You are negative for COVID.  You have another virus.  You should feel better in a week or 2.  I sent cough medicine.  Increase rest and fluids. - The x-rays are normal.  No evidence of rib fracture, fluid in your lungs or acute abdominal injury.  Take Tylenol for the discomfort and apply ice.  May also consider topical lidocaine. - You do not have a UTI based on your urine sample.  I am culturing it we will call you if you need antibiotics.

## 2022-09-04 NOTE — ED Triage Notes (Signed)
Pt c/o possible sinus infection and UTI  Pt is having green and yellow productive cough, nasal congestion x4days. Pt states that she is urinating frequently and when she coughs, she pees. Pt states she has had this issue for years.   Pt fell last week and hurt her back. Pt states that she took her shoes off and slipped, landed on a chair and then slid to the floor.

## 2022-09-04 NOTE — ED Provider Notes (Signed)
[facility]    CSN: [id number] Arrival date & time: 09/04/22  1246      History   Chief Complaint Chief Complaint  Patient presents with   Urinary Tract Infection   Cough    HPI Cathy Nadel is a 77 y.o. female presenting for multiple complaints.  First she states she has had a cough and congestion for the past 4 days.  She also reports a lot of fatigue.  Denies fever, sore throat, chest pain, shortness of breath.  No known COVID or flu exposure.  Has been taking over-the-counter Sudafed.  Patient also reporting increased urinary frequency but denies any dysuria, urgency, vaginal discharge or odor.  Concern for possible UTI.  Additionally she reports that about a week ago she slipped and fell backward onto her back and left side.  She has had some pain along the left ribs and increased discomfort when she takes of breath.  She was says she wants to make sure her kidneys okay.  She denies any blood in her urine.  Not reporting any other injuries.  No other complaints.  HPI  Past Medical History:  Diagnosis Date   Allergy    History of kidney stones 08/2021   Hypertension    Osteoarthritis    Sleep apnea    cannot use CPAP    Patient Active Problem List   Diagnosis Date Noted   Hypertension 11/24/2016   Carotid stenosis 11/24/2016    Past Surgical History:  Procedure Laterality Date   CATARACT EXTRACTION W/PHACO Left 01/12/2022   Procedure: CATARACT EXTRACTION PHACO AND INTRAOCULAR LENS PLACEMENT (IOC) LEFT VIVITY LENS 3.16 00:30.0;  Surgeon: Eulogio Bear, MD;  Location: [place];  Service: Ophthalmology;  Laterality: Left;   CATARACT EXTRACTION W/PHACO Right 02/02/2022   Procedure: CATARACT EXTRACTION PHACO AND INTRAOCULAR LENS PLACEMENT ([place]) RIGHT;  Surgeon: Eulogio Bear, MD;  Location: [place];  Service: Ophthalmology;  Laterality: Right;  2.70 0:25.8   SPINAL FUSION     Cervical   TUBAL LIGATION      OB History    No obstetric history on file.      Home Medications    Prior to Admission medications   Medication Sig Start Date End Date Taking? Authorizing Provider  aspirin 81 MG chewable tablet Chew 81 mg by mouth.   Yes [provider]  Cholecalciferol (VITAMIN D) 2000 units tablet Take by mouth.   Yes [provider]  fluorometholone (FML) 0.1 % ophthalmic ointment Place 1 application  into both eyes daily.   Yes [provider]  FLUTICASONE PROPIONATE, NASAL, NA Place into the nose as needed.   Yes [provider]  ibuprofen (ADVIL,MOTRIN) 200 MG tablet Take 200 mg by mouth every 8 (eight) hours as needed.   Yes [provider]  LORazepam (ATIVAN) 1 MG tablet Take 1 mg by mouth every 8 (eight) hours as needed for anxiety.   Yes [provider]  losartan (COZAAR) 50 MG tablet Take by mouth.   Yes [provider]  meloxicam (MOBIC) 7.5 MG tablet Take 7.5 mg by mouth in the morning and at bedtime.   Yes [provider]  omeprazole (PRILOSEC) 20 MG capsule Take 20 mg by mouth daily.   Yes [provider]  PARoxetine (PAXIL) 20 MG tablet 30 mg daily. 11/05/16  Yes [provider]  promethazine-dextromethorphan (PROMETHAZINE-DM) 6.25-15 MG/5ML syrup Take 5 mLs by mouth 4 (four) times daily as needed. 09/04/22  Yes Laurene Footman B, PA-C  B Complex Vitamins (VITAMIN B COMPLEX PO) Take by mouth. Patient not taking: Reported on 12/18/2021    [provider]  busPIRone (BUSPAR) 15 MG tablet TK 1 T PO BID Patient not taking: Reported on 12/18/2021 02/13/16   [provider]  cyanocobalamin 100 MCG tablet Take by mouth. Patient not taking: Reported on 12/18/2021    [provider]  D-Mannose 500 MG CAPS Take 3 capsules by mouth daily.    [provider]  famotidine (PEPCID) 40 MG tablet Take 40 mg by mouth daily. Patient not taking: Reported on 01/12/2022    [provider]     Family History Family History  Problem Relation Age of Onset   Cancer Sister    Heart attack Brother    Cancer Brother     Social History Social History   Tobacco Use   Smoking status: Former    Packs/day: 1.00    Years: 15.00    Total pack years: 15.00    Types: Cigarettes    Quit date: 1990    Years since quitting: 34.0   Smokeless tobacco: Never  Vaping Use   Vaping Use: Never used  Substance Use Topics   Alcohol use: Yes    Comment: occasionally   Drug use: No     Allergies   Patient has no known allergies.   Review of Systems Review of Systems  Constitutional:  Positive for fatigue. Negative for chills, diaphoresis and fever.  HENT:  Positive for congestion and rhinorrhea. Negative for ear pain, sinus pressure, sinus pain and sore throat.   Respiratory:  Positive for cough. Negative for shortness of breath and wheezing.   Cardiovascular:  Negative for chest pain.  Gastrointestinal:  Negative for abdominal pain, diarrhea, nausea and vomiting.  Genitourinary:  Positive for flank pain and frequency. Negative for decreased urine volume, dysuria, hematuria, pelvic pain, urgency, vaginal bleeding, vaginal discharge and vaginal pain.  Musculoskeletal:  Positive for back pain. Negative for arthralgias and myalgias.  Skin:  Negative for rash.  Neurological:  Negative for weakness and headaches.  Hematological:  Negative for adenopathy.     Physical Exam Triage Vital Signs ED Triage Vitals  Enc Vitals Group     BP      Pulse      Resp      Temp      Temp src      SpO2      Weight      Height      Head Circumference      Peak Flow      Pain Score      Pain Loc      Pain Edu?      Excl. in [place]?    No data found.  Updated Vital Signs BP (!) 149/83 (BP Location: Left Arm)   Pulse 87   Temp 98.6 F (37 C) (Oral)   Resp 18   Ht 5\' 3"  (1.6 m)   Wt 200 lb (90.7 kg)   SpO2 97%   BMI 35.43 kg/m    Physical Exam Vitals and nursing note reviewed.   Constitutional:      General: She is not in acute distress.    Appearance: Normal appearance. She is not ill-appearing or toxic-appearing.  HENT:     Head: Normocephalic and atraumatic.     Nose: Congestion present.     Mouth/Throat:     Mouth: Mucous membranes are moist.  Pharynx: Oropharynx is clear. Posterior oropharyngeal erythema present.  Eyes:     General: No scleral icterus.       Right eye: No discharge.        Left eye: No discharge.     Conjunctiva/sclera: Conjunctivae normal.  Cardiovascular:     Rate and Rhythm: Normal rate and regular rhythm.     Heart sounds: Normal heart sounds.  Pulmonary:     Effort: Pulmonary effort is normal. No respiratory distress.     Breath sounds: Normal breath sounds.  Abdominal:     Palpations: Abdomen is soft.     Tenderness: There is no abdominal tenderness. There is left CVA tenderness (mildly). There is no right CVA tenderness.  Musculoskeletal:     Cervical back: Neck supple.     Comments: Back and ribs: There is a large yellowish contusion of the left lateral ribs and flank region.  It is faint.  There is tenderness palpation throughout the area of contusion and along the left lateral ribs and mildly some left CVA tenderness.  Skin:    General: Skin is dry.  Neurological:     General: No focal deficit present.     Mental Status: She is alert. Mental status is at baseline.     Motor: No weakness.     Gait: Gait normal.  Psychiatric:        Mood and Affect: Mood normal.        Behavior: Behavior normal.        Thought Content: Thought content normal.      UC Treatments / Results  Labs (all labs ordered are listed, but only abnormal results are displayed) Labs Reviewed  URINALYSIS, ROUTINE W REFLEX MICROSCOPIC - Abnormal; Notable for the following components:      Result Value   Glucose, UA 100 (*)    Hgb urine dipstick SMALL (*)    All other components within normal limits  URINALYSIS, MICROSCOPIC (REFLEX) -  Abnormal; Notable for the following components:   Bacteria, UA FEW (*)    All other components within normal limits  SARS CORONAVIRUS 2 BY RT PCR  URINE CULTURE    EKG   Radiology DG Abdomen 1 View  Result Date: 09/04/2022 CLINICAL DATA:  Left flank pain after falling. EXAM: ABDOMEN - 1 VIEW COMPARISON:  None Available. FINDINGS: Chest and rib radiographs are dictated separately. The bowel gas pattern is nonobstructive. There is moderate stool throughout the colon. No supine evidence of free intraperitoneal air or bowel wall thickening. Bilateral tubal ligation clips are noted. There are degenerative changes in the lumbar spine. No acute fractures are identified. IMPRESSION: No evidence of acute abdominal injury. Moderate colonic stool burden. Electronically Signed   By: Richardean Sale M.D.   On: 09/04/2022 14:52   DG Ribs Unilateral W/Chest Left  Result Date: 09/04/2022 CLINICAL DATA:  LEFT rib pain and bruising EXAM: LEFT RIBS AND CHEST - 3+ VIEW COMPARISON:  None Available. FINDINGS: No fracture or other bone lesions are seen involving the ribs. There is no evidence of pneumothorax or pleural effusion. Both lungs are clear. Heart size and mediastinal contours are within normal limits. Anterior cervical fusion IMPRESSION: No evidence of rib fracture.  No pneumothorax. Electronically Signed   By: Suzy Bouchard M.D.   On: 09/04/2022 14:50    Procedures Procedures (including critical care time)  Medications Ordered in UC Medications - No data to display  Initial Impression / Assessment and Plan / UC Course  I have reviewed the triage vital signs and the nursing notes.  Pertinent labs & imaging results that were available during my care of the patient were reviewed by me and considered in my medical decision making (see chart for details).   77 year old female presenting for multiple complaints.  Reports cough and congestion as well as fatigue x 4 days.  No fever.  Also reporting  urinary frequency for the past few days and pain in the left side of her back and ribs since a fall last week.  She is afebrile and overall well-appearing.  In no acute distress.  On exam she has nasal congestion, mild posterior pharyngeal erythema.  Chest clear auscultation heart regular rate and rhythm.  She has large yellowish fading contusion of the left side of her back and ribs.  Tenderness throughout the area.  COVID test obtained as well as x-rays to assess for possible fracture.  Ordered left ribs, chest x-ray and KUB.  Negative COVID test.  Chest x-ray and KUB are normal.    Urinalysis without evidence of UTI but will culture and call patient if she needs antibiotics.    Discussed all results with patient.  Reviewed RICE guidelines for her back pain and injury.  Reviewed supportive care and taking prescribed cough medication Promethazine DM as needed for viral URI.  Plenty of rest and fluids.  Reviewed return and ER precautions.   Final Clinical Impressions(s) / UC Diagnoses   Final diagnoses:  Viral upper respiratory tract infection  Acute cough  Other fatigue  Urinary frequency  Contusion of left side of back, initial encounter  Fall, initial encounter     Discharge Instructions      -You are negative for COVID.  You have another virus.  You should feel better in a week or 2.  I sent cough medicine.  Increase rest and fluids. - The x-rays are normal.  No evidence of rib fracture, fluid in your lungs or acute abdominal injury.  Take Tylenol for the discomfort and apply ice.  May also consider topical lidocaine. - You do not have a UTI based on your urine sample.  I am culturing it we will call you if you need antibiotics.     ED Prescriptions     Medication Sig Dispense Auth. Provider   promethazine-dextromethorphan (PROMETHAZINE-DM) 6.25-15 MG/5ML syrup Take 5 mLs by mouth 4 (four) times daily as needed. 118 mL Danton Clap, PA-C      PDMP not reviewed  this encounter.   Danton Clap, PA-C 09/04/22 1536

## 2022-09-06 LAB — URINE CULTURE

## 2022-10-28 ENCOUNTER — Ambulatory Visit: Payer: Medicare Other | Admitting: Obstetrics and Gynecology

## 2022-10-28 NOTE — Progress Notes (Deleted)
[facility] New Patient Evaluation and Consultation  Referring Provider: Dola Argyle, MD PCP: Dola Argyle, MD Date of Service: 10/28/2022  SUBJECTIVE Chief Complaint: No chief complaint on file.  History of Present Illness: Cathy Harrell is a 77 y.o. White or Caucasian female seen in consultation at the request of Dr. Benard Rink for evaluation of mixed incontinence.    Review of records significant for: Seen by Dr Alto Denver at [facility] and had urodynamic testing 11/05/21:  Assessment: 77 y.o. year-old who presents with stress urinary incontinence. This is different from prior video urodynamics. Voiding pressures were slightly higher with continued, unchanged slight narrowing at the level of the pelvic floor. Emptying was complete.  1. Sensation: increased 2. Capacity: decreased  3. Compliance: normal 4. Stress Incontinence: Demonstrated. Calculated cuff leak point pressure 63 cm of water 5. Detrusor Overactivity: present 6. Emptying: Incomplete but acceptable 7. Outlet: See above   She had cysto w/ lithotripsy for renal stone in Jan 2023.   Urinary Symptoms: {urine leakage?:24754} Leaks *** time(s) per {days/wks/mos/yrs:310907}.  Pad use: {NUMBERS 1-10:18281} {pad option:24752} per day.   She {ACTION; IS/IS [id number] bothered by her UI symptoms.  Day time voids ***.  Nocturia: *** times per night to void. Voiding dysfunction: she {empties:24755} her bladder well.  {DOES NOT does:27190::"does not"} use a catheter to empty bladder.  When urinating, she feels {urine symptoms:24756} Drinks: *** per day  UTIs: {NUMBERS 1-10:18281} UTI's in the last year.   {ACTIONS;DENIES/REPORTS:21021675::"Denies"} history of {urologic concerns:24757}  Pelvic Organ Prolapse Symptoms:                  She {denies/ admits to:24761} a feeling of a bulge the vaginal area. It has been present for {NUMBER 1-10:22536} {days/wks/mos/yrs:310907}.  She {denies/ admits to:24761} seeing a  bulge.  This bulge {ACTION; IS/IS [id number]:[id number] bothersome.  Bowel Symptom: Bowel movements: *** time(s) per {Time; day/week/month:13537} Stool consistency: {stool consistency:24758} Straining: {yes/no:19897}.  Splinting: {yes/no:19897}.  Incomplete evacuation: {yes/no:19897}.  She {denies/ admits to:24761} accidental bowel leakage / fecal incontinence  Occurs: *** time(s) per {Time; day/week/month:13537}  Consistency with leakage: {stool consistency:24758} Bowel regimen: {bowel regimen:24759} Last colonoscopy: Date ***, Results ***  Sexual Function Sexually active: {yes/no:19897}.  Sexual orientation: {Sexual Orientation:[phone]} Pain with sex: {pain with sex:24762}  Pelvic Pain {denies/ admits to:24761} pelvic pain Location: *** Pain occurs: *** Prior pain treatment: *** Improved by: *** Worsened by: ***   Past Medical History:  Past Medical History:  Diagnosis Date   Allergy    History of kidney stones 08/2021   Hypertension    Osteoarthritis    Sleep apnea    cannot use CPAP     Past Surgical History:   Past Surgical History:  Procedure Laterality Date   CATARACT EXTRACTION W/PHACO Left 01/12/2022   Procedure: CATARACT EXTRACTION PHACO AND INTRAOCULAR LENS PLACEMENT (IOC) LEFT VIVITY LENS 3.16 00:30.0;  Surgeon: Eulogio Bear, MD;  Location: [place];  Service: Ophthalmology;  Laterality: Left;   CATARACT EXTRACTION W/PHACO Right 02/02/2022   Procedure: CATARACT EXTRACTION PHACO AND INTRAOCULAR LENS PLACEMENT ([place]) RIGHT;  Surgeon: Eulogio Bear, MD;  Location: [place];  Service: Ophthalmology;  Laterality: Right;  2.70 0:25.8   SPINAL FUSION     Cervical   TUBAL LIGATION       Past OB/GYN History: G{NUMBERS 1-10:18281} P{NUMBERS 1-10:18281} Vaginal deliveries: ***,  Forceps/ Vacuum deliveries: ***, Cesarean section: *** Menopausal: {menopausal:24763} Contraception: ***. Last pap smear was ***.  Any history of abnormal  pap  smears: {yes/no:19897}.   Medications: She has a current medication list which includes the following prescription(s): aspirin, b complex vitamins, buspirone, vitamin d, cyanocobalamin, d-mannose, famotidine, fluorometholone, fluticasone propionate, ibuprofen, lorazepam, losartan, meloxicam, omeprazole, paroxetine, and promethazine-dextromethorphan.   Allergies: Patient has No Known Allergies.   Social History:  Social History   Tobacco Use   Smoking status: Former    Packs/day: 1.00    Years: 15.00    Total pack years: 15.00    Types: Cigarettes    Quit date: 1990    Years since quitting: 34.1   Smokeless tobacco: Never  Vaping Use   Vaping Use: Never used  Substance Use Topics   Alcohol use: Yes    Comment: occasionally   Drug use: No    Relationship status: {relationship status:24764} She lives with ***.   She {ACTION; IS/IS [id number] employed ***. Regular exercise: {Yes/No:[id number]} History of abuse: {Yes/No:304960894}  Family History:   Family History  Problem Relation Age of Onset   Cancer Sister    Heart attack Brother    Cancer Brother      Review of Systems: ROS   OBJECTIVE Physical Exam: There were no vitals filed for this visit.  Physical Exam   GU / Detailed Urogynecologic Evaluation:  Pelvic Exam: Normal external female genitalia; Bartholin's and Skene's glands normal in appearance; urethral meatus normal in appearance, no urethral masses or discharge.   CST: {gen negative/positive:315881}  Reflexes: bulbocavernosis {DESC; PRESENT/NOT PRESENT:21021351}, anocutaneous {DESC; PRESENT/NOT PRESENT:21021351} ***bilaterally.  Speculum exam reveals normal vaginal mucosa {With/Without:20273} atrophy. Cervix {exam; gyn cervix:30847}. Uterus {exam; pelvic uterus:30849}. Adnexa {exam; adnexa:12223}.    s/p hysterectomy: Speculum exam reveals normal vaginal mucosa {With/Without:20273}  atrophy and normal vaginal cuff.  Adnexa {exam; adnexa:12223}.     With apex supported, anterior compartment defect was {reduced:24765}  Pelvic floor strength {Roman # I-V:19040}/V, puborectalis {Roman # I-V:19040}/V external anal sphincter {Roman # I-V:19040}/V  Pelvic floor musculature: Right levator {Tender/Non-tender:20250}, Right obturator {Tender/Non-tender:20250}, Left levator {Tender/Non-tender:20250}, Left obturator {Tender/Non-tender:20250}  POP-Q:   POP-Q                                               Aa                                               Ba                                                 C                                                Gh                                               Pb  tvl                                                Ap                                               Bp                                                 D      Rectal Exam:  Normal sphincter tone, {rectocele:24766} distal rectocele, enterocoele {DESC; PRESENT/NOT PRESENT:21021351}, no rectal masses, {sign of:24767} dyssynergia when asking the patient to bear down.  Post-Void Residual (PVR) by Bladder Scan: In order to evaluate bladder emptying, we discussed obtaining a postvoid residual and she agreed to this procedure.  Procedure: The ultrasound unit was placed on the patient's abdomen in the suprapubic region after the patient had voided. A PVR of *** ml was obtained by bladder scan.  Laboratory Results: '@ENCLABS'$ @   ***I visualized the urine specimen, noting the specimen to be {urine color:24768}  ASSESSMENT AND PLAN Ms. Vongphakdy is a 77 y.o. with: No diagnosis found.    Jaquita Folds, MD   Medical Decision Making:  - Reviewed/ ordered a clinical laboratory test - Reviewed/ ordered a radiologic study - Reviewed/ ordered medicine test - Decision to obtain old records - Discussion of management of or test interpretation with an external physician / other  healthcare professional  - Assessment requiring independent historian - Review and summation of prior records - Independent review of image, tracing or specimen

## 2022-11-17 ENCOUNTER — Ambulatory Visit: Payer: Medicare Other | Admitting: Obstetrics and Gynecology

## 2022-12-25 ENCOUNTER — Ambulatory Visit: Payer: Medicare Other | Admitting: Obstetrics and Gynecology

## 2022-12-25 ENCOUNTER — Encounter: Payer: Self-pay | Admitting: Obstetrics and Gynecology

## 2022-12-25 ENCOUNTER — Other Ambulatory Visit (HOSPITAL_COMMUNITY)
Admission: RE | Admit: 2022-12-25 | Discharge: 2022-12-25 | Disposition: A | Discharge status: Home / Self Care | Payer: Medicare Other | Attending: Obstetrics and Gynecology | Admitting: Obstetrics and Gynecology

## 2022-12-25 VITALS — BP 153/90 | HR 93 | Ht 62.3 in | Wt 203.2 lb

## 2022-12-25 DIAGNOSIS — R159 Full incontinence of feces: Secondary | ICD-10-CM

## 2022-12-25 DIAGNOSIS — R319 Hematuria, unspecified: Secondary | ICD-10-CM

## 2022-12-25 DIAGNOSIS — N3281 Overactive bladder: Secondary | ICD-10-CM

## 2022-12-25 DIAGNOSIS — N816 Rectocele: Secondary | ICD-10-CM | POA: Diagnosis not present

## 2022-12-25 DIAGNOSIS — R350 Frequency of micturition: Secondary | ICD-10-CM | POA: Diagnosis not present

## 2022-12-25 DIAGNOSIS — N393 Stress incontinence (female) (male): Secondary | ICD-10-CM

## 2022-12-25 LAB — POCT URINALYSIS DIPSTICK
Bilirubin, UA: NEGATIVE
Glucose, UA: NEGATIVE
Ketones, UA: NEGATIVE
Leukocytes, UA: NEGATIVE
Nitrite, UA: NEGATIVE
Protein, UA: POSITIVE — AB
Spec Grav, UA: >=1.03 — AB (ref 1.010–1.025)
Urobilinogen, UA: 1 E.U./dL
pH, UA: 6.5 (ref 5.0–8.0)

## 2022-12-25 LAB — URINALYSIS, ROUTINE W REFLEX MICROSCOPIC
Glucose, UA: NEGATIVE mg/dL
Ketones, ur: NEGATIVE mg/dL
Leukocytes,Ua: NEGATIVE
Nitrite: NEGATIVE
Protein, ur: 30 mg/dL — AB
Specific Gravity, Urine: 1.025 (ref 1.005–1.030)
pH: 5 (ref 5.0–8.0)

## 2022-12-25 NOTE — Progress Notes (Signed)
[facility] New Patient Evaluation and Consultation  Referring Provider: Randalyn Rhea, MD PCP: Laqueta Due, MD Date of Service: 12/25/2022  SUBJECTIVE Chief Complaint: New Patient (Initial Visit) Renada Cronin is a 77 y.o. female is here for urinary incontinence.)  History of Present Illness: Venita Seng is a 77 y.o. White or Caucasian female seen in consultation at the request of Dr. Raoul Pitch for evaluation of mixed stress and urge incontinence.    Review of records from Dr Raoul Pitch significant for: Has been seeing Urology at [facility] for history of renal stones. Also has symptoms of SUI. She has tried pelvic PT. Currently taking vesicare 10mg  daily, also previously tried myrbetriq 50mg  and oxybutynin  Had urodynamic testing 10/2021 which showed:  1. Sensation: increased 2. Capacity: decreased  3. Compliance: normal 4. Stress Incontinence: Demonstrated. Calculated cuff leak point pressure 63 cm of water 5. Detrusor Overactivity: present 6. Emptying: Incomplete but acceptable 7. Outlet: MICTURITION STUDY: Voiding was performed in the sitting position. Opening pressure was 17 cm of water and Pdet at Qmax was 60m of water. Qmax was 8.8 mL/sec. It was a intermittent pattern. She voided and had a residual of 40mL. It was a non volitional void, with without a sustained contraction. Abdominal strain was not present. It represented normal habits.   Urinary Symptoms: Leaks urine with cough/ sneeze, laughing, exercise, lifting, going from sitting to standing, with a full bladder, with movement to the bathroom, with urgency, while asleep, and continuously She has no control of her bladder- when the urge comes, she just pees. UUI > SUI Leaks 3+ time(s) per day.  Pad use: 4 adult diapers per day.   She is bothered by her UI symptoms. Tried: vesicare, myrbetriq and oxybutynin.  Pelvic physical therapy did not help her.   Day time voids 3.  Nocturia: 1-2 times per  night to void. Voiding dysfunction: she does not empty her bladder well.  does not use a catheter to empty bladder.  When urinating, she feels dribbling after finishing, the need to urinate multiple times in a row, and to push on her belly or vagina to empty bladder Drinks: 2 cups coffee in AM, 3- 8oz glasses water, orange juice per day, occasional gingerale or pepsi   UTIs:  0  UTI's in the last year.   Denies history of blood in urine and kidney or bladder stones  Pelvic Organ Prolapse Symptoms:                  She Denies a feeling of a bulge the vaginal area.  Bowel Symptom: Bowel movements: 1 time(s) per day Stool consistency: hard Straining: yes.  Splinting: no.  Incomplete evacuation: no.   Admits to accidental bowel leakage / fecal incontinence  Occurs: small amount every day  Consistency with leakage: solid Bowel regimen: none Last colonoscopy: Date 12/2021- reports negative  Sexual Function Sexually active: no.    Pelvic Pain Denies pelvic pain    Past Medical History:  Past Medical History:  Diagnosis Date   Allergy    History of kidney stones 08/2021   Hypertension    Osteoarthritis    Sleep apnea    cannot use CPAP     Past Surgical History:   Past Surgical History:  Procedure Laterality Date   CATARACT EXTRACTION W/PHACO Left 01/12/2022   Procedure: CATARACT EXTRACTION PHACO AND INTRAOCULAR LENS PLACEMENT (IOC) LEFT VIVITY LENS 3.16 00:30.0;  Surgeon: Nevada Crane, MD;  Location: [facility];  Service: Ophthalmology;  Laterality: Left;   CATARACT EXTRACTION W/PHACO Right 02/02/2022   Procedure: CATARACT EXTRACTION PHACO AND INTRAOCULAR LENS PLACEMENT (IOC) RIGHT;  Surgeon: Nevada Crane, MD;  Location: [facility];  Service: Ophthalmology;  Laterality: Right;  2.70 0:25.8   SPINAL FUSION     Cervical   TUBAL LIGATION       Past OB/GYN History: OB History  Gravida Para Term Preterm AB Living  2 2 2     1   SAB IAB  Ectopic Multiple Live Births               # Outcome Date GA Lbr Len/2nd Weight Sex Delivery Anes PTL Lv  2 Term      Vag-Spont     1 Term      Vag-Spont       Menopausal: Yes, Denies vaginal bleeding since menopause Last pap smear was 2014- negative.     Medications: She has a current medication list which includes the following prescription(s): aspirin, vitamin d, famotidine, fluorometholone, fluticasone propionate, ibuprofen, lorazepam, losartan, meloxicam, omeprazole, paroxetine, and promethazine-dextromethorphan.   Allergies: Patient has No Known Allergies.   Social History:  Social History   Tobacco Use   Smoking status: Former    Packs/day: 1.00    Years: 15.00    Additional pack years: 0.00    Total pack years: 15.00    Types: Cigarettes    Quit date: 1990    Years since quitting: 34.3   Smokeless tobacco: Never  Vaping Use   Vaping Use: Never used  Substance Use Topics   Alcohol use: Yes    Comment: occasionally   Drug use: No    Relationship status: divorced She lives alone.   She is not employed. Regular exercise: No History of abuse: No  Family History:   Family History  Problem Relation Age of Onset   Cancer Sister    Heart attack Brother    Cancer Brother    Atrial fibrillation Brother      Review of Systems: Review of Systems  Constitutional:  Negative for fever, malaise/fatigue and weight loss.  Respiratory:  Negative for cough, shortness of breath and wheezing.   Cardiovascular:  Positive for leg swelling. Negative for chest pain and palpitations.  Gastrointestinal:  Negative for abdominal pain and blood in stool.  Genitourinary:  Negative for dysuria.  Musculoskeletal:  Positive for myalgias.  Skin:  Negative for rash.  Neurological:  Negative for dizziness and headaches.  Endo/Heme/Allergies:  Does not bruise/bleed easily.  Psychiatric/Behavioral:  Negative for depression. The patient is nervous/anxious.      OBJECTIVE Physical  Exam: Vitals:   12/25/22 1256 12/25/22 1307  BP: (!) 147/89 (!) 153/90  Pulse: 91 93  Weight: 203 lb 3.2 oz (92.2 kg)   Height: 5' 2.3" (1.582 m)     Physical Exam Constitutional:      General: She is not in acute distress. Pulmonary:     Effort: Pulmonary effort is normal.  Abdominal:     General: There is no distension.     Palpations: Abdomen is soft.     Tenderness: There is no abdominal tenderness. There is no rebound.  Musculoskeletal:        General: No swelling. Normal range of motion.  Skin:    General: Skin is warm and dry.     Findings: No rash.  Neurological:     Mental Status: She is alert and oriented to person, place, and time.  Psychiatric:        Mood and Affect: Mood normal.        Behavior: Behavior normal.      GU / Detailed Urogynecologic Evaluation:  Pelvic Exam: Normal external female genitalia; Bartholin's and Skene's glands normal in appearance; urethral meatus normal in appearance, no urethral masses or discharge.   CST: negative Urethra was prepped with betadine and straight catheter placed. PVR 20ml Speculum exam reveals normal vaginal mucosa with atrophy. Cervix normal appearance. Uterus normal single, nontender. Adnexa no mass, fullness, tenderness.     Pelvic floor strength I/V, puborectalis I/V external anal sphincter I/V  Pelvic floor musculature: Right levator non-tender, Right obturator non-tender, Left levator non-tender, Left obturator non-tender  POP-Q:   POP-Q  -3                                            Aa   -3                                           Ba  -8                                              C   3                                            Gh  4                                            Pb  8                                            tvl   -0.5                                            Ap  -0.5                                            Bp  -7                                              D       Rectal Exam:  Normal sphincter tone, moderate distal rectocele, enterocoele not present, no rectal masses, noted dyssynergia when asking the patient to bear down.  Post-Void Residual (PVR) by Bladder Scan: In order to evaluate bladder emptying, we discussed obtaining a postvoid residual and she agreed to  this procedure.  Procedure: The ultrasound unit was placed on the patient's abdomen in the suprapubic region after the patient had voided. A PVR of 11 ml was obtained by bladder scan.  Laboratory Results: POC urine: moderate blood   ASSESSMENT AND PLAN Ms. Ohlson is a 77 y.o. with:  1. Overactive bladder   2. Urinary frequency   3. Prolapse of posterior vaginal wall   4. Incontinence of feces, unspecified fecal incontinence type   5. SUI (stress urinary incontinence, female)   6. Hematuria, unspecified type    OAB - We discussed the symptoms of overactive bladder (OAB), which include urinary urgency, urinary frequency, nocturia, with or without urge incontinence.  While we do not know the exact etiology of OAB, several treatment options exist. We discussed management including behavioral therapy (decreasing bladder irritants, urge suppression strategies, timed voids, bladder retraining), physical therapy, medication; for refractory cases posterior tibial nerve stimulation, sacral neuromodulation, and intravesical botulinum toxin injection.  - She would be a good candidate for sacral neuromodulation. Has failed vesicare, myrbetriq and oxybutynin. - We discussed the role of sacral neuromodulation and how it works. It requires a test phase, and documentation of bladder function via diary. After a successful test period, a permanent wire and generator are placed in the OR.   The goal of this therapy is at least a 50% improvement in symptoms. It is NOT realistic to expect a 100% cure.  We reviewed the fact that about 30% of patients fail the test phase and are not candidates for  permanent generator placement.   - She seemed slightly overwhelmed at today's visit, so will have her fill out a baseline bladder diary then return to discuss the two difference SNM devices. We can then schedule PNE in the office. Handout also provided about the procedure.   2. Accidental Bowel Leakage:  - Treatment options include anti-diarrhea medication (loperamide/ Imodium OTC or prescription lomotil), fiber supplements, physical therapy, and possible sacral neuromodulation or surgery.   -recommended daily psyllium fiber supplement. She wants to pursue SNM (see above)  3. SUI - For treatment of stress urinary incontinence,  non-surgical options include expectant management, weight loss, physical therapy, as well as a pessary.  Surgical options include a midurethral sling, Burch urethropexy, and transurethral injection of a bulking agent. - This is not as bothersome for her. We reviewed all options and she wants to pursue SNM first then reassess SUI symptoms.   4. Blood in urine - will send for micro UA and culture  Return 1 month    Marguerita Beards, MD

## 2022-12-25 NOTE — Patient Instructions (Addendum)
Today we talked about ways to manage bladder urgency such as altering your diet to avoid irritative beverages and foods (bladder diet) as well as attempting to decrease stress and other exacerbating factors.   The Most Bothersome Foods* The Least Bothersome Foods*  Coffee - Regular & Decaf Tea - caffeinated Carbonated beverages - cola, non-colas, diet & caffeine-free Alcohols - Beer, Red Wine, White Wine, Champagne Fruits - Grapefruit, Lemon, Orange, Pineapple Fruit Juices - Cranberry, Grapefruit, Orange, Pineapple Vegetables - Tomato & Tomato Products Flavor Enhancers - Hot peppers, Spicy foods, Chili, Horseradish, Vinegar, Monosodium glutamate (MSG) Artificial Sweeteners - NutraSweet, Sweet 'N Low, Equal (sweetener), Saccharin Ethnic foods - Mexican, Thai, Indian food Water Milk - low-fat & whole Fruits - Bananas, Blueberries, Honeydew melon, Pears, Raisins, Watermelon Vegetables - Broccoli, Brussels Sprouts, Cabbage, Carrots, Cauliflower, Celery, Cucumber, Mushrooms, Peas, Radishes, Squash, Zucchini, White potatoes, Sweet potatoes & yams Poultry - Chicken, Eggs, Turkey, Meat - Beef, Pork, Lamb Seafood - Shrimp, Tuna fish, Salmon Grains - Oat, Rice Snacks - Pretzels, Popcorn  *Friedlander J. et al. Diet and its role in interstitial cystitis/bladder pain syndrome (IC/BPS) and comorbid conditions. [employer]. [employer] Int. 2012 Jan 11.   Accidental Bowel Leakage: Our goal is to achieve formed bowel movements daily or every-other-day without leakage.  You may need to try different combinations of the following options to find what works best for you.  Some management options include: Dietary changes (more leafy greens, vegetables and fruits; less processed foods) Fiber supplementation (Metamucil or something with psyllium as active ingredient) Over-the-counter imodium (tablets or liquid) to help solidify the stool and prevent leakage of stool. If you get constipated you can use Miralax as  needed to achieve bowel movements.   

## 2023-02-02 ENCOUNTER — Encounter: Payer: Self-pay | Admitting: Obstetrics and Gynecology

## 2023-02-02 ENCOUNTER — Ambulatory Visit (INDEPENDENT_AMBULATORY_CARE_PROVIDER_SITE_OTHER): Payer: Medicare Other | Admitting: Obstetrics and Gynecology

## 2023-02-02 VITALS — BP 146/90 | HR 87

## 2023-02-02 DIAGNOSIS — R159 Full incontinence of feces: Secondary | ICD-10-CM

## 2023-02-02 DIAGNOSIS — N3281 Overactive bladder: Secondary | ICD-10-CM | POA: Diagnosis not present

## 2023-02-02 DIAGNOSIS — R350 Frequency of micturition: Secondary | ICD-10-CM | POA: Diagnosis not present

## 2023-02-02 NOTE — Patient Instructions (Signed)
Axonics therapy is the SNM that will assist in your overactive bladder

## 2023-02-02 NOTE — Progress Notes (Signed)
[facility] Return Visit  SUBJECTIVE  History of Present Illness: Mariella Mehlhorn is a 77 y.o. female seen in follow-up for OAB and FI. Plan at last visit was consider SNM options.   Patient reports she has decided to go forward with the trial phase of SNM.   Past Medical History: Patient  has a past medical history of Allergy, History of kidney stones (08/2021), Hypertension, Osteoarthritis, and Sleep apnea.   Past Surgical History: She  has a past surgical history that includes Spinal fusion; Tubal ligation; Cataract extraction w/PHACO (Left, 01/12/2022); and Cataract extraction w/PHACO (Right, 02/02/2022).   Medications: She has a current medication list which includes the following prescription(s): aspirin, vitamin d, famotidine, fluorometholone, fluticasone propionate, ibuprofen, lorazepam, losartan, meloxicam, omeprazole, paroxetine, and promethazine-dextromethorphan.   Allergies: Patient has No Known Allergies.   Social History: Patient  reports that she quit smoking about 34 years ago. Her smoking use included cigarettes. She has a 15.00 pack-year smoking history. She has never used smokeless tobacco. She reports current alcohol use. She reports that she does not use drugs.      OBJECTIVE     Physical Exam: Vitals:   02/02/23 1120  BP: (!) 146/90  Pulse: 87   Gen: No apparent distress, A&O x 3.  Detailed Urogynecologic Evaluation:  Deferred.    ASSESSMENT AND PLAN    Ms. Cobbler is a 77 y.o. with:  1. Overactive bladder   2. Urinary frequency   3. Incontinence of feces, unspecified fecal incontinence type    Patient to plan for SNM. We discussed the Axonics versus the [employer] devices. She would prefer Axonics due to complexity of Medtronic remote. We discussed there are two different options for battery including rechargeable and non-rechargeable. At this time we are planning to go with the non-rechargeable system option.   Patient to follow up for  PNE with Dr. Florian Buff.

## 2023-09-27 ENCOUNTER — Encounter: Payer: Self-pay | Admitting: Ophthalmology

## 2023-09-29 ENCOUNTER — Encounter: Payer: Self-pay | Admitting: Ophthalmology

## 2023-10-06 NOTE — Discharge Instructions (Signed)
 INSTRUCTIONS FOLLOWING OCULOPLASTIC SURGERY AMY EMERSON GAY, MD  AFTER YOUR EYE SURGERY, THER ARE MANY THINGS WHICH YOU, THE PATIENT, CAN DO TO ASSURE THE BEST POSSIBLE RESULT FROM YOUR OPERATION.  THIS SHEET SHOULD BE REFERRED TO WHENEVER QUESTIONS ARISE.  IF THERE ARE ANY QUESTIONS NOT ANSWERED HERE, DO NOT HESITATE TO CALL OUR OFFICE AT [phone] OR [phone].  THERE IS ALWAYS SOMEONE AVAILABLE TO CALL IF QUESTIONS OR PROBLEMS ARISE.  VISION: Your vision may be blurred and out of focus after surgery until you are able to stop using your ointment, swelling resolves and your eye(s) heal. This may take 1 to 2 weeks at the least.  If your vision becomes gradually more dim or dark, this is not normal and you need to call our office immediately.  EYE CARE: For the first 48 hours after surgery, use ice packs frequently - "20 minutes on, 20 minutes off" - to help reduce swelling and bruising.  Small bags of frozen peas or corn make good ice packs along with cloths soaked in ice water.  If you are wearing a patch or other type of dressing following surgery, keep this on for the amount of time specified by your doctor.  For the first week following surgery, you will need to treat your stitches with great care.  It is OK to shower, but take care to not allow soapy water to run into your eye(s) to help reduce chances of infection.  You may gently clean the eyelashes and around the eye(s) with cotton balls and bottled water, BUT DO NOT RUB THE STITCHES VIGOROUSLY.  Keeping your stitches moist with ointment will help promote healing with minimal scar formation.  ACTIVITY: When you leave the surgery center, you should go home, rest and be inactive.  The eye(s) may feel scratchy and keeping the eyes closed will allow for faster healing.  The first week following surgery, avoid straining (anything making the face turn red) or lifting over 20 pounds.  Additionally, avoid bending which causes your head to go below  your waist.  Using your eyes will NOT harm them, so feel free to read, watch television, use the computer, etc as desired.  Driving depends on each individual, so check with your doctor if you have questions about driving. Do not wear contact lenses for about 2 weeks.  Do not wear eye makeup for 2 weeks.  Avoid swimming, hot tubs, gardening, and dusting for 1 to 2 weeks to reduce the risk of an infection.  MEDICATIONS:  You will be given a prescription for an ointment to use 4 times a day on your stitches.  You can use the ointment in your eyes if they feel scratchy or irritated.  If you eyelid(s) don't close completely when you sleep, put some ointment in your eyes before bedtime.  EMERGENCY: If you experience SEVERE EYE PAIN OR HEADACHE UNRELIEVED BY TYLENOL OR TRAMADOL , NAUSEA OR VOMITING, WORSENING REDNESS, OR WORSENING VISION (ESPECIALLY VISION THAT WAS INITIALLY BETTER) CALL [phone] OR [phone] DURING BUSINESS HOURS OR AFTER HOURS.

## 2023-10-07 ENCOUNTER — Ambulatory Visit
Admission: RE | Admit: 2023-10-07 | Discharge: 2023-10-07 | Disposition: A | Discharge status: Home / Self Care | Payer: Medicare Other | Attending: Ophthalmology | Admitting: Ophthalmology

## 2023-10-07 ENCOUNTER — Other Ambulatory Visit: Payer: Self-pay

## 2023-10-07 ENCOUNTER — Ambulatory Visit: Payer: Medicare Other | Admitting: Anesthesiology

## 2023-10-07 ENCOUNTER — Encounter: Payer: Self-pay | Admitting: Ophthalmology

## 2023-10-07 ENCOUNTER — Encounter
Admission: RE | Disposition: A | Discharge status: Home / Self Care | Payer: Self-pay | Source: Home / Self Care | Attending: Ophthalmology

## 2023-10-07 DIAGNOSIS — K760 Fatty (change of) liver, not elsewhere classified: Secondary | ICD-10-CM | POA: Diagnosis not present

## 2023-10-07 DIAGNOSIS — G4730 Sleep apnea, unspecified: Secondary | ICD-10-CM | POA: Insufficient documentation

## 2023-10-07 DIAGNOSIS — Z8673 Personal history of transient ischemic attack (TIA), and cerebral infarction without residual deficits: Secondary | ICD-10-CM | POA: Insufficient documentation

## 2023-10-07 DIAGNOSIS — H02834 Dermatochalasis of left upper eyelid: Secondary | ICD-10-CM | POA: Diagnosis present

## 2023-10-07 DIAGNOSIS — K219 Gastro-esophageal reflux disease without esophagitis: Secondary | ICD-10-CM | POA: Insufficient documentation

## 2023-10-07 DIAGNOSIS — Z6836 Body mass index (BMI) 36.0-36.9, adult: Secondary | ICD-10-CM | POA: Diagnosis not present

## 2023-10-07 DIAGNOSIS — I10 Essential (primary) hypertension: Secondary | ICD-10-CM | POA: Insufficient documentation

## 2023-10-07 DIAGNOSIS — H02831 Dermatochalasis of right upper eyelid: Secondary | ICD-10-CM | POA: Diagnosis present

## 2023-10-07 DIAGNOSIS — E66813 Obesity, class 3: Secondary | ICD-10-CM | POA: Insufficient documentation

## 2023-10-07 DIAGNOSIS — F419 Anxiety disorder, unspecified: Secondary | ICD-10-CM | POA: Diagnosis not present

## 2023-10-07 HISTORY — DX: Morbid (severe) obesity due to excess calories: E66.01

## 2023-10-07 HISTORY — PX: BROW LIFT: SHX178

## 2023-10-07 HISTORY — DX: Other symptoms and signs involving the nervous system: R29.818

## 2023-10-07 HISTORY — DX: Atherosclerotic heart disease of native coronary artery without angina pectoris: I25.10

## 2023-10-07 SURGERY — BLEPHAROPLASTY
Anesthesia: Monitor Anesthesia Care | Site: Eye | Laterality: Bilateral

## 2023-10-07 MED ORDER — LIDOCAINE-EPINEPHRINE 2 %-1:100000 IJ SOLN
INTRAMUSCULAR | Status: DC | PRN
  Administered 2023-10-07: 3 mL via OPHTHALMIC

## 2023-10-07 MED ORDER — TETRACAINE HCL 0.5 % OP SOLN
OPHTHALMIC | Status: DC | PRN
  Administered 2023-10-07: 2 [drp] via OPHTHALMIC

## 2023-10-07 MED ORDER — MIDAZOLAM HCL 2 MG/2ML IJ SOLN
INTRAMUSCULAR | Status: AC
  Filled 2023-10-07: qty 2

## 2023-10-07 MED ORDER — PROPOFOL 500 MG/50ML IV EMUL
INTRAVENOUS | Status: DC | PRN
  Administered 2023-10-07: 40 mg via INTRAVENOUS
  Administered 2023-10-07: 75 ug/kg/min via INTRAVENOUS

## 2023-10-07 MED ORDER — DEXMEDETOMIDINE HCL IN NACL 80 MCG/20ML IV SOLN
INTRAVENOUS | Status: AC
  Filled 2023-10-07: qty 20

## 2023-10-07 MED ORDER — SODIUM CHLORIDE 0.9% FLUSH: 3.0000 mL | Freq: Two times a day (BID) | INTRAVENOUS | Status: DC

## 2023-10-07 MED ORDER — DEXMEDETOMIDINE HCL IN NACL 200 MCG/50ML IV SOLN
INTRAVENOUS | Status: DC | PRN
  Administered 2023-10-07: 10 ug via INTRAVENOUS

## 2023-10-07 MED ORDER — BSS IO SOLN
INTRAOCULAR | Status: DC | PRN
  Administered 2023-10-07: 15 mL via INTRAOCULAR

## 2023-10-07 MED ORDER — ERYTHROMYCIN 5 MG/GM OP OINT: TOPICAL_OINTMENT | OPHTHALMIC | 2 refills | Status: AC

## 2023-10-07 MED ORDER — FENTANYL CITRATE (PF) 100 MCG/2ML IJ SOLN
INTRAMUSCULAR | Status: DC | PRN
  Administered 2023-10-07 (×2): 25 ug via INTRAVENOUS
  Administered 2023-10-07: 50 ug via INTRAVENOUS

## 2023-10-07 MED ORDER — ERYTHROMYCIN 5 MG/GM OP OINT
TOPICAL_OINTMENT | OPHTHALMIC | Status: DC | PRN
  Administered 2023-10-07: 1 via OPHTHALMIC

## 2023-10-07 MED ORDER — TRAMADOL HCL 50 MG PO TABS: ORAL_TABLET | ORAL | 0 refills | Status: AC

## 2023-10-07 MED ORDER — MIDAZOLAM HCL 2 MG/2ML IJ SOLN
INTRAMUSCULAR | Status: DC | PRN
  Administered 2023-10-07: 2 mg via INTRAVENOUS

## 2023-10-07 MED ORDER — FENTANYL CITRATE (PF) 100 MCG/2ML IJ SOLN
INTRAMUSCULAR | Status: AC
  Filled 2023-10-07: qty 2

## 2023-10-07 MED ORDER — ONDANSETRON HCL 4 MG/2ML IJ SOLN
INTRAMUSCULAR | Status: DC | PRN
  Administered 2023-10-07: 4 mg via INTRAVENOUS

## 2023-10-07 MED ORDER — SODIUM CHLORIDE 0.9% FLUSH: 3.0000 mL | INTRAVENOUS | Status: DC | PRN

## 2023-10-07 SURGICAL SUPPLY — 20 items
APPLICATOR COTTON TIP WD 3 STR (MISCELLANEOUS) ×1 IMPLANT
BLADE SURG 15 STRL LF DISP TIS (BLADE) ×1 IMPLANT
CORD BIP STRL DISP 12FT (MISCELLANEOUS) ×1 IMPLANT
GAUZE SPONGE 2X2 STRL 8-PLY (GAUZE/BANDAGES/DRESSINGS) ×10 IMPLANT
GAUZE SPONGE 4X4 12PLY STRL (GAUZE/BANDAGES/DRESSINGS) ×1 IMPLANT
GLOVE SURG LX STRL 7.0 MICRO (GLOVE) IMPLANT
GOWN STRL REUS W/ TWL LRG LVL3 (GOWN DISPOSABLE) ×1 IMPLANT
MARKER SKIN XFINE TIP W/RULER (MISCELLANEOUS) ×1 IMPLANT
NDL FILTER BLUNT 18X1 1/2 (NEEDLE) ×1 IMPLANT
NDL HYPO 30X.5 LL (NEEDLE) ×2 IMPLANT
NEEDLE FILTER BLUNT 18X1 1/2 (NEEDLE) ×1
NEEDLE HYPO 30X.5 LL (NEEDLE) ×2
PACK ENT CUSTOM (PACKS) ×1 IMPLANT
SOL PREP PVP 2OZ (MISCELLANEOUS) ×1
SOLUTION PREP PVP 2OZ (MISCELLANEOUS) ×1 IMPLANT
STRAP BODY AND KNEE 60X3 (MISCELLANEOUS) IMPLANT
SUT GUT PLAIN 6-0 1X18 ABS (SUTURE) ×1 IMPLANT
SYR 10ML LL (SYRINGE) ×1 IMPLANT
SYR 3ML LL SCALE MARK (SYRINGE) ×1 IMPLANT
WATER STERILE IRR 250ML POUR (IV SOLUTION) ×1 IMPLANT

## 2023-10-07 NOTE — H&P (Signed)
 [facility]: [facility]  Primary Care Physician:  Wray Raring, MD Ophthalmologist: Dr. Greig HERO. Ashley, M.D.  Pre-Procedure History & Physical: HPI:  Cathy Harrell is a 78 y.o. female here for periocular surgery.   Past Medical History:  Diagnosis Date   Allergy    Coronary artery disease    History of kidney stones 08/2021   Hypertension    Neurogenic claudication    Osteoarthritis    Severe obesity (HCC)    Sleep apnea    cannot use CPAP    Past Surgical History:  Procedure Laterality Date   CATARACT EXTRACTION W/PHACO Left 01/12/2022   Procedure: CATARACT EXTRACTION PHACO AND INTRAOCULAR LENS PLACEMENT (IOC) LEFT VIVITY LENS 3.16 00:30.0;  Surgeon: Myrna Adine Anes, MD;  Location: [facility];  Service: Ophthalmology;  Laterality: Left;   CATARACT EXTRACTION W/PHACO Right 02/02/2022   Procedure: CATARACT EXTRACTION PHACO AND INTRAOCULAR LENS PLACEMENT (IOC) RIGHT;  Surgeon: Myrna Adine Anes, MD;  Location[facility];  Service: Ophthalmology;  Laterality: Right;  2.70 0:25.8   SPINAL FUSION     Cervical   TUBAL LIGATION      Prior to Admission medications   Medication Sig Start Date End Date Taking? Authorizing Provider  aspirin 81 MG chewable tablet Chew 81 mg by mouth.   Yes [provider]  Cholecalciferol (VITAMIN D) 2000 units tablet Take by mouth.   Yes [provider]  fluorometholone (FML) 0.1 % ophthalmic ointment Place 1 application  into both eyes daily.   Yes [provider]  LORazepam (ATIVAN) 1 MG tablet Take 1 mg by mouth every 8 (eight) hours as needed for anxiety.   Yes [provider]  losartan (COZAAR) 50 MG tablet Take by mouth.   Yes [provider]  meloxicam (MOBIC) 7.5 MG tablet Take 7.5 mg by mouth in the morning and at bedtime.   Yes [provider]  omeprazole (PRILOSEC) 20 MG capsule Take 40 mg by mouth daily.   Yes [provider]  PARoxetine  (PAXIL) 20 MG tablet 30 mg daily. 11/05/16  Yes [provider]  rosuvastatin (CRESTOR) 20 MG tablet Take 20 mg by mouth daily.   Yes [provider]  famotidine (PEPCID) 40 MG tablet Take 40 mg by mouth daily. Patient not taking: Reported on 09/27/2023    [provider]  FLUTICASONE PROPIONATE, NASAL, NA Place into the nose as needed. Patient not taking: Reported on 09/27/2023    [provider]  ibuprofen (ADVIL,MOTRIN) 200 MG tablet Take 200 mg by mouth every 8 (eight) hours as needed. Patient not taking: Reported on 09/27/2023    [provider]  promethazine -dextromethorphan (PROMETHAZINE -DM) 6.25-15 MG/5ML syrup Take 5 mLs by mouth 4 (four) times daily as needed. Patient not taking: Reported on 09/27/2023 09/04/22   Arvis Jolan NOVAK, PA-C  Semaglutide-Weight Management (WEGOVY) 2.4 MG/0.75ML SOAJ Inject 2.4 mg into the skin once a week. Patient not taking: Reported on 09/27/2023    [provider]    Allergies as of 08/11/2023   (No Known Allergies)    Family History  Problem Relation Age of Onset   Cancer Sister    Heart attack Brother    Cancer Brother    Atrial fibrillation Brother     Social History   Socioeconomic History   Marital status: Divorced    Spouse name: Not on file   Number of children: Not on file   Years of education: Not on file  Highest education level: Not on file  Occupational History   Not on file  Tobacco Use   Smoking status: Former    Current packs/day: 0.00    Average packs/day: 1 pack/day for 15.0 years (15.0 ttl pk-yrs)    Types: Cigarettes    Start date: 61    Quit date: 71    Years since quitting: 35.1   Smokeless tobacco: Never  Vaping Use   Vaping status: Never Used  Substance and Sexual Activity   Alcohol use: Yes    Comment: occasionally   Drug use: No   Sexual activity: Not Currently  Other Topics Concern   Not on file  Social History Narrative   Not on file   Social  Drivers of Health   Financial Resource Strain: Low Risk  (09/10/2022)   Received from [facility], [facility]   Overall Financial Resource Strain (CARDIA)    Difficulty of Paying Living Expenses: Not hard at all  Food Insecurity: No Food Insecurity (09/10/2022)   Received from [facility], [facility]   Hunger Vital Sign    Worried About Running Out of Food in the Last Year: Never true    Ran Out of Food in the Last Year: Never true  Transportation Needs: No Transportation Needs (09/10/2022)   Received from [facility], [facility]   PRAPARE - Transportation    Lack of Transportation (Medical): No    Lack of Transportation (Non-Medical): No  Physical Activity: Inactive (09/10/2022)   Received from [facility], [facility]   Exercise Vital Sign    Days of Exercise per Week: 0 days    Minutes of Exercise per Session: 0 min  Stress: Stress Concern Present (09/10/2022)   Received from [facility], [facility] - Occupational Stress Questionnaire    Feeling of Stress : To some extent  Social Connections: Socially Isolated (09/10/2022)   Received from [facility], [facility]   Social Connection and Isolation Panel [NHANES]    Frequency of Communication with Friends and Family: More than three times a week    Frequency of Social Gatherings with Friends and Family: More than three times a week    Attends Religious Services: Never    Database Administrator or Organizations: No    Attends Banker Meetings: Never    Marital Status: Divorced  Catering Manager Violence: Not At Risk (09/10/2022)   Received from [facility], [facility]   Humiliation, Afraid, Rape, and Kick questionnaire    Fear of Current or Ex-Partner: No    Emotionally Abused: No    Physically Abused: No    Sexually Abused: No    Review of Systems: See HPI, otherwise negative ROS  Physical Exam: BP 132/76    Pulse 88   Temp (!) 97.3 F (36.3 C) (Temporal)   Resp 16   Ht 5' 3 (1.6 m)   Wt 83 kg   SpO2 98%   BMI 32.42 kg/m  General:   Alert and cooperative in NAD Head:  Normocephalic and atraumatic. Respiratory:  Normal work of breathing.  Impression/Plan: Cathy Harrell is here for periocular surgery.  Risks, benefits, limitations, and alternatives regarding surgery have been reviewed with the patient.  Questions have been answered.  All parties agreeable.   Ashley Greig HERO, MD  10/07/2023, 2:05 PM

## 2023-10-07 NOTE — Interval H&P Note (Signed)
 History and Physical Interval Note:  10/07/2023 2:05 PM  Cathy Harrell  has presented today for surgery, with the diagnosis of H02.831 Dermatochalasis of Right Upper Eyelid H02.834 Dermatochalasis of Left Upper Eyelid.  The various methods of treatment have been discussed with the patient and family. After consideration of risks, benefits and other options for treatment, the patient has consented to  Procedure(s): BLEPHAROPLASTY UPPER EYELID; W/EXCESS SKIN BILATERAL (Bilateral) as a surgical intervention.  [name] history has been reviewed, patient examined, no change in status, stable for surgery.  I have reviewed the patient's chart and labs.  Questions were answered to the patient's satisfaction.     Ashley, Socorro Kanitz M

## 2023-10-07 NOTE — Op Note (Signed)
Preoperative Diagnosis:  Visually significant dermatochalasis bilateral  Upper Eyelid(s)  Postoperative Diagnosis:  Same.  Procedure(s) Performed:   Upper eyelid blepharoplasty with excess skin excision  bilateral  Upper Eyelid(s)  Surgeon: Philis Pique. Vickki Muff, M.D.  Assistants: none  Anesthesia: MAC  Specimens: None.  Estimated Blood Loss: Minimal.  Complications: None.  Operative Findings: None Dictated  Procedure:   Allergies were reviewed and the patient is allergic to Patient has no known allergies..   After the risks, benefits, complications and alternatives were discussed with the patient, appropriate informed consent was obtained and the patient was brought to the operating suite. The patient was reclined supine and a timeout was conducted.  The patient was then sedated.  Local anesthetic consisting of a 50-50 mixture of 2% lidocaine with epinephrine and 0.75% bupivacaine with added Hylenex was injected subcutaneously to both  upper eyelid(s). After adequate local was instilled, the patient was prepped and draped in the usual sterile fashion for eyelid surgery.   Attention was turned to the upper eyelids. A 52m upper eyelid crease incision line was marked with calipers on both  upper eyelid(s).  A pinch test was used to estimate the amount of excess skin to remove and this was marked in standard blepharoplasty style fashion. Attention was turned to the  right  upper eyelid. A #15 blade was used to open the premarked incision line. A Skin and muscle flap was excised and hemostasis was obtained with bipolar cautery.   Attention was then turned to the opposite eyelid where the same procedure was performed in the same manner. Hemostasis was obtained with bipolar cautery throughout. All incisions were then closed with a combination of running and interrupted 6-0 fast absorbing plain suture. The patient tolerated the procedure well.  Erythromycin ophthalmic ointment was applied to her  incision sites, followed by ice packs. She was taken to the recovery area where she recovered without difficulty.  Post-Op Plan/Instructions:  The patient was instructed to use ice packs frequently for the next 48 hours. She was instructed to use Erythromycin ophthalmic ointment on her incisions 4 times a day for the next 12 to 14 days. She was given a prescription for tramadol (or similar) for pain control should Tylenol not be effective. She was [facility] to [facility] [place] [facility].  Tateanna Bach M. FVickki Muff M.D. Ophthalmology

## 2023-10-07 NOTE — Anesthesia Preprocedure Evaluation (Signed)
 Anesthesia Evaluation  Patient identified by MRN, date of birth, ID band Patient awake    Reviewed: NPO status   Airway Mallampati: II  TM Distance: >3 FB Neck ROM: full    Dental no notable dental hx.    Pulmonary sleep apnea (no cpap) , former smoker   Pulmonary exam normal        Cardiovascular Exercise Tolerance: Good hypertension, Normal cardiovascular exam  stress echo: 08/2021:  Left Ventricle    Ultrasound enhancing agent used to enhance wall motion interpretation.    Normal left venticular systolic function with no regional wall motion  abnormalities noted at rest.    No regional wall motion abnormalities noted post stress.    Overall global left ventricular systolic function increased post-stress.    Normal augmentation of all wall segments without evidence of ischemia with  stress.     Neuro/Psych   Anxiety     TIA (last 2013)   GI/Hepatic Neg liver ROS,GERD  Controlled,,Hepatic steatosis   Endo/Other    Class 3 obesity (bmi 36)  Renal/GU negative Renal ROS  negative genitourinary   Musculoskeletal  (+) Arthritis ,    Abdominal   Peds  Hematology negative hematology ROS (+)   Anesthesia Other Findings pcp: 01/2022: a[facility];  had ecce on 01/12/2022;    Reproductive/Obstetrics                              Anesthesia Physical Anesthesia Plan  ASA: 2  Anesthesia Plan: MAC   Post-op Pain Management:    Induction:   PONV Risk Score and Plan: 2 and Midazolam  and TIVA  Airway Management Planned:   Additional Equipment:   Intra-op Plan:   Post-operative Plan:   Informed Consent: I have reviewed the patients History and Physical, chart, labs and discussed the procedure including the risks, benefits and alternatives for the proposed anesthesia with the patient or authorized representative who has indicated his/her understanding and acceptance.       Plan  Discussed with: CRNA  Anesthesia Plan Comments:          Anesthesia Quick Evaluation

## 2023-10-07 NOTE — Anesthesia Postprocedure Evaluation (Signed)
 Anesthesia Post Note  Patient: Jacqui Headen  Procedure(s) Performed: BLEPHAROPLASTY UPPER EYELID; W/EXCESS SKIN BILATERAL (Bilateral: Eye)  Patient location during evaluation: PACU Anesthesia Type: MAC Level of consciousness: awake and alert Pain management: pain level controlled Vital Signs Assessment: post-procedure vital signs reviewed and stable Respiratory status: spontaneous breathing, nonlabored ventilation, respiratory function stable and patient connected to nasal cannula oxygen Cardiovascular status: blood pressure returned to baseline and stable Postop Assessment: no apparent nausea or vomiting Anesthetic complications: no  No notable events documented.   Last Vitals:  Vitals:   10/07/23 1502 10/07/23 1510  BP:  121/69  Pulse: 81 73  Resp: 16 17  Temp:    SpO2: 95% 96%    Last Pain:  Vitals:   10/07/23 1510  TempSrc:   PainSc: 0-No pain                 Debby Mines

## 2023-10-07 NOTE — Transfer of Care (Signed)
 Immediate Anesthesia Transfer of Care Note  Patient: Cathy Harrell  Procedure(s) Performed: BLEPHAROPLASTY UPPER EYELID; W/EXCESS SKIN BILATERAL (Bilateral: Eye)  Patient Location: PACU  Anesthesia Type: MAC  Level of Consciousness: awake, alert  and patient cooperative  Airway and Oxygen Therapy: Patient Spontanous Breathing and Patient connected to supplemental oxygen  Post-op Assessment: Post-op Vital signs reviewed, Patient's Cardiovascular Status Stable, Respiratory Function Stable, Patent Airway and No signs of Nausea or vomiting  Post-op Vital Signs: Reviewed and stable  Complications: No notable events documented.

## 2023-10-08 ENCOUNTER — Encounter: Payer: Self-pay | Admitting: Ophthalmology
